# Patient Record
Sex: Female | Born: 1985 | Race: Black or African American | Hispanic: No | Marital: Single | Smoking: Never smoker
Health system: Southern US, Community
[De-identification: ages and names within clinical notes are randomized; demographics above are authoritative.]

## PROBLEM LIST (undated history)

## (undated) DIAGNOSIS — I639 Cerebral infarction, unspecified: Secondary | ICD-10-CM

## (undated) DIAGNOSIS — R569 Unspecified convulsions: Secondary | ICD-10-CM

## (undated) HISTORY — DX: Cerebral infarction, unspecified: I63.9

## (undated) HISTORY — DX: Unspecified convulsions: R56.9

---
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
## 2009-12-15 HISTORY — PX: DILATION AND CURETTAGE OF UTERUS: SHX78

## 2011-04-07 ENCOUNTER — Ambulatory Visit (INDEPENDENT_AMBULATORY_CARE_PROVIDER_SITE_OTHER): Payer: BC Managed Care – PPO | Admitting: Family Medicine

## 2011-04-07 ENCOUNTER — Encounter: Payer: Self-pay | Admitting: Family Medicine

## 2011-04-07 VITALS — BP 94/68 | HR 96 | Temp 98.0°F | Ht 59.5 in | Wt 98.2 lb

## 2011-04-07 DIAGNOSIS — Z0001 Encounter for general adult medical examination with abnormal findings: Secondary | ICD-10-CM | POA: Insufficient documentation

## 2011-04-07 DIAGNOSIS — I639 Cerebral infarction, unspecified: Secondary | ICD-10-CM | POA: Insufficient documentation

## 2011-04-07 DIAGNOSIS — Z8673 Personal history of transient ischemic attack (TIA), and cerebral infarction without residual deficits: Secondary | ICD-10-CM

## 2011-04-07 DIAGNOSIS — Z0000 Encounter for general adult medical examination without abnormal findings: Secondary | ICD-10-CM | POA: Insufficient documentation

## 2011-04-07 DIAGNOSIS — G40209 Localization-related (focal) (partial) symptomatic epilepsy and epileptic syndromes with complex partial seizures, not intractable, without status epilepticus: Secondary | ICD-10-CM | POA: Insufficient documentation

## 2011-04-07 DIAGNOSIS — G40909 Epilepsy, unspecified, not intractable, without status epilepticus: Secondary | ICD-10-CM | POA: Insufficient documentation

## 2011-04-07 LAB — COMPREHENSIVE METABOLIC PANEL
ALT: 9 U/L (ref 0–35)
Albumin: 4.4 g/dL (ref 3.5–5.2)
CO2: 26 mEq/L (ref 19–32)
Chloride: 99 mEq/L (ref 96–112)
GFR: 197.84 mL/min (ref >60.00)
Potassium: 4 mEq/L (ref 3.5–5.1)
Sodium: 138 mEq/L (ref 135–145)
Total Bilirubin: 0.6 mg/dL (ref 0.3–1.2)
Total Protein: 7.5 g/dL (ref 6.0–8.3)

## 2011-04-07 LAB — CBC WITH DIFFERENTIAL/PLATELET
Basophils Relative: 0.4 % (ref 0.0–3.0)
Eosinophils Relative: 0 % (ref 0.0–5.0)
Hemoglobin: 13.8 g/dL (ref 12.0–15.0)
Lymphocytes Relative: 23.2 % (ref 12.0–46.0)
MCV: 90.6 fl (ref 78.0–100.0)
Monocytes Absolute: 0.4 10*3/uL (ref 0.1–1.0)
Neutrophils Relative %: 70.1 % (ref 43.0–77.0)
RBC: 4.42 Mil/uL (ref 3.87–5.11)
WBC: 6.7 10*3/uL (ref 4.5–10.5)

## 2011-04-07 LAB — TSH: TSH: 0.76 u[IU]/mL (ref 0.35–5.50)

## 2011-04-07 LAB — LIPID PANEL: VLDL: 8.4 mg/dL (ref 0.0–40.0)

## 2011-04-07 MED ORDER — OXCARBAZEPINE 600 MG PO TABS: 900.0000 mg | ORAL_TABLET | Freq: Two times a day (BID) | ORAL | Status: DC

## 2011-04-07 NOTE — Progress Notes (Addendum)
Subjective:    Patient ID: Lori Ballard, female    DOB: 1986/07/08, 25 y.o.   MRN: [id number]  HPI CC: new patient, establish, CPE  No questions or concerns today.  Recently moved back into parent's house, wanted to start anew.    H/o seizures, unsure cause, started on oxcarbazepine since 2003, last seizure was years ago.  Neurology - last saw [facility], while in high school.  Prescribed trileptal in [place].  Will see who MD was there and request records.   Weight fluctuates - 89lbs to 98lbs.  Always has been small.  Eats well, 3 square meals a day.  Parents tall, weight average.  Denies eating issues, intolerances to foods.  Regular periods, LMP 2 wks ago.  Currently sexually active.  Protection 100% time.  Requests referral to GYN to get established.  Preventative: Unsure tetanus, flu Last blood work 1-2 years ago, told everything fine.  No CPE.  Medications and allergies reviewed and updated as above. PMHx, SurgHx, SHx and FMHx reviewed for relevance and updated in chart. Review of Systems  Constitutional: Positive for unexpected weight change (weight fluctuates (5-10lbs)). Negative for fever, chills, activity change, appetite change and fatigue.  HENT: Negative for hearing loss and neck pain.   Eyes: Negative for visual disturbance.  Respiratory: Negative for choking, chest tightness, shortness of breath, wheezing and stridor.   Cardiovascular: Negative for chest pain, palpitations and leg swelling.  Gastrointestinal: Negative for nausea, vomiting, abdominal pain, diarrhea, constipation, blood in stool and abdominal distention.  Genitourinary: Negative for hematuria and difficulty urinating.  Musculoskeletal: Negative for myalgias and arthralgias.  Skin: Negative for rash.  Neurological: Positive for seizures. Negative for dizziness, syncope and headaches.  Hematological: Does not bruise/bleed easily.  Psychiatric/Behavioral: Negative for dysphoric mood. The patient is not  nervous/anxious.        Objective:   Physical Exam  Nursing note and vitals reviewed. Constitutional: She is oriented to person, place, and time. She appears well-developed and well-nourished. No distress.       Thin, small frame  HENT:  Head: Normocephalic and atraumatic.  Right Ear: External ear normal.  Left Ear: External ear normal.  Nose: Nose normal.  Mouth/Throat: Oropharynx is clear and moist. No oropharyngeal exudate.  Eyes: Conjunctivae and EOM are normal. Pupils are equal, round, and reactive to light. No scleral icterus.  Neck: Normal range of motion. Neck supple. No thyromegaly present.  Cardiovascular: Normal rate, regular rhythm, normal heart sounds and intact distal pulses.   No murmur heard. Pulses:      Radial pulses are 2+ on the right side, and 2+ on the left side.  Pulmonary/Chest: Effort normal and breath sounds normal. No respiratory distress. She has no wheezes. She has no rales.  Abdominal: Soft. Bowel sounds are normal. She exhibits no distension and no mass. There is no tenderness. There is no rebound and no guarding.  Musculoskeletal: Normal range of motion. She exhibits no edema and no tenderness.       R UE baseline in flexion, hand in flexion  Lymphadenopathy:    She has no cervical adenopathy.  Neurological: She is alert and oriented to person, place, and time.       CN grossly intact, station intact.  Gait R leg walking on tip toes  Skin: Skin is warm and dry. No rash noted. No erythema.  Psychiatric: She has a normal mood and affect. Her behavior is normal. Judgment and thought content normal.  Assessment & Plan:

## 2011-04-07 NOTE — Assessment & Plan Note (Signed)
States controlled on trileptal.  Will request records from prior MD and review.  Check Na today.

## 2011-04-07 NOTE — Patient Instructions (Addendum)
Fill out release of information for [place] doctor and practice and mail to them or bring back so we can fax request. 3 meals a day, ensure getting enough calcium/vit D (goal is 1200mg  calcium, 800IU vitamin D daily). Consider starting multivitamin. Consider getting set up with GYN. Blood work today. Good to meet you today, call us with questions.

## 2011-04-07 NOTE — Assessment & Plan Note (Signed)
Discussed option of returning to PT/OT.  Pt declines currently, states does stretching exercises at home.

## 2011-04-07 NOTE — Assessment & Plan Note (Signed)
Discussed health maintenance.  Given h/o seizures, held off on immunizations today.  Will await to review records.  Provided pt with ROI to either send in herself or bring back filled out for [place] to fax. Baseline blood work today.  Pt would like well woman through GYN, will call [facility] to set that up.

## 2011-06-19 ENCOUNTER — Other Ambulatory Visit: Payer: Self-pay | Admitting: *Deleted

## 2011-06-19 MED ORDER — OXCARBAZEPINE 600 MG PO TABS: 900.0000 mg | ORAL_TABLET | Freq: Two times a day (BID) | ORAL | Status: DC

## 2011-06-19 NOTE — Telephone Encounter (Signed)
Printed and routed to Kim. 

## 2011-06-19 NOTE — Telephone Encounter (Signed)
Mom is asking for a written script to pick up so that she can mail in to [facility].

## 2011-06-19 NOTE — Telephone Encounter (Signed)
Notified that Rx ready and placed up front for pick up.

## 2011-06-30 ENCOUNTER — Other Ambulatory Visit: Payer: Self-pay | Admitting: *Deleted

## 2011-06-30 MED ORDER — OXCARBAZEPINE 600 MG PO TABS: 900.0000 mg | ORAL_TABLET | Freq: Two times a day (BID) | ORAL | Status: DC

## 2011-06-30 NOTE — Telephone Encounter (Signed)
Printed

## 2011-06-30 NOTE — Telephone Encounter (Signed)
Patient notified and Rx placed up front for pick up. 

## 2011-06-30 NOTE — Telephone Encounter (Signed)
Patient's mother says that they picked the rx up last week and mailed it to [facility] last week, but she was notified by the pos office that the rx had been lost. She is asking if they can pick up anther rx to mail in.

## 2011-08-07 ENCOUNTER — Encounter: Payer: Self-pay | Admitting: Family Medicine

## 2011-08-07 ENCOUNTER — Ambulatory Visit (INDEPENDENT_AMBULATORY_CARE_PROVIDER_SITE_OTHER): Payer: BC Managed Care – PPO | Admitting: Family Medicine

## 2011-08-07 VITALS — BP 106/70 | HR 100 | Temp 99.0°F | Wt 88.2 lb

## 2011-08-07 DIAGNOSIS — H60399 Other infective otitis externa, unspecified ear: Secondary | ICD-10-CM

## 2011-08-07 DIAGNOSIS — H6090 Unspecified otitis externa, unspecified ear: Secondary | ICD-10-CM

## 2011-08-07 MED ORDER — HYDROCORTISONE-ACETIC ACID 1-2 % OT SOLN: 4.0000 [drp] | Freq: Three times a day (TID) | OTIC | Status: AC

## 2011-08-07 NOTE — Assessment & Plan Note (Signed)
Mild. Treat with vosol hc x 3-4 days. Update if not improving or any worsening, would likely add abx drops

## 2011-08-07 NOTE — Progress Notes (Signed)
  Subjective:    Patient ID: Lori Ballard, female    DOB: [birth year], 25 y.o.   MRN: [id number]  HPI CC: L ear pain  Woke up yesterday morning with bad ear ache, feels every time swallows.  achey pain.  Intermittent, fine now.  Also hurt in front of ear.  No tooth pain, sinus pressure or congestion, hearing changes, draining, tinnitus, fevers/chills, cough.  Did return from cruise recently, but no swimming.  Review of Systems Per HPI    Objective:   Physical Exam  Nursing note and vitals reviewed. Constitutional: She appears well-developed and well-nourished. No distress.  HENT:  Head: Normocephalic and atraumatic.  Right Ear: Hearing, tympanic membrane, external ear and ear canal normal.  Left Ear: Hearing, tympanic membrane and external ear normal. No mastoid tenderness. Tympanic membrane is not perforated.  Ears:  Nose: Nose normal.  Mouth/Throat: Uvula is midline, oropharynx is clear and moist and mucous membranes are normal. No oropharyngeal exudate, posterior oropharyngeal edema, posterior oropharyngeal erythema or tonsillar abscesses.       Left posterior outer canal with slight irritation/maceration, no erythema. No external pain at pinna, mastoid, tragus.  Eyes: Conjunctivae and EOM are normal. Pupils are equal, round, and reactive to light. No scleral icterus.  Neck: Normal range of motion. Neck supple.  Lymphadenopathy:    She has no cervical adenopathy.  Skin: Skin is warm and dry. No rash noted.          Assessment & Plan:

## 2011-08-07 NOTE — Patient Instructions (Signed)
External canal looks a bit irritated - treat with ear drops for next 3-4 days.  If worsening, fever >101, or not better after this, let me know. May also try tylenol or ibuprofen for discomfort.

## 2011-10-14 ENCOUNTER — Telehealth: Payer: Self-pay | Admitting: *Deleted

## 2011-10-14 NOTE — Telephone Encounter (Signed)
Filled out and placed in Kim's box. 

## 2011-10-14 NOTE — Telephone Encounter (Signed)
Patient notified and paperwork place up front for pick up.

## 2011-10-14 NOTE — Telephone Encounter (Signed)
Pt has brought in a form for a handicapped placard, this is in your in box.

## 2012-01-27 ENCOUNTER — Ambulatory Visit: Payer: BC Managed Care – PPO | Admitting: Family Medicine

## 2012-02-03 ENCOUNTER — Ambulatory Visit (INDEPENDENT_AMBULATORY_CARE_PROVIDER_SITE_OTHER): Payer: BC Managed Care – PPO | Admitting: *Deleted

## 2012-02-03 DIAGNOSIS — Z111 Encounter for screening for respiratory tuberculosis: Secondary | ICD-10-CM

## 2012-04-05 ENCOUNTER — Other Ambulatory Visit: Payer: Self-pay | Admitting: Family Medicine

## 2012-04-05 ENCOUNTER — Telehealth: Payer: Self-pay | Admitting: Family Medicine

## 2012-04-05 NOTE — Telephone Encounter (Signed)
Pt is out of refills for Oxcarbazepine. She is wanting to know if she can get more refills. I told her she will need to make an appointment for a follow up to get the refills but she wanted to know if I would ask to make sure if she has to come in or not ??? She uses [facility] and is completely out she was wondering if she could get a 30 day supply to a drug store to get her by until her RX comes in from [facility].

## 2012-04-05 NOTE — Telephone Encounter (Signed)
Patient notified. She just wanted to make sure it was sent in. She said she should have enough to last until she receives it, but if not she will call back for a local refill.

## 2012-04-05 NOTE — Telephone Encounter (Signed)
plz notify this was sent in already.

## 2012-09-11 ENCOUNTER — Other Ambulatory Visit: Payer: Self-pay | Admitting: Family Medicine

## 2012-11-10 ENCOUNTER — Other Ambulatory Visit: Payer: Self-pay | Admitting: Family Medicine

## 2012-11-10 NOTE — Telephone Encounter (Signed)
OK to refill

## 2012-11-10 NOTE — Telephone Encounter (Signed)
Sent in.  Pt needs OV.

## 2012-12-27 ENCOUNTER — Other Ambulatory Visit: Payer: Self-pay | Admitting: Family Medicine

## 2012-12-27 NOTE — Telephone Encounter (Signed)
Pt needs office visit for med refill or physical.  plz schedule this.  Sent in 30d supply.

## 2012-12-29 NOTE — Telephone Encounter (Signed)
Patient notified and appt scheduled.

## 2012-12-31 ENCOUNTER — Ambulatory Visit (INDEPENDENT_AMBULATORY_CARE_PROVIDER_SITE_OTHER): Payer: BC Managed Care – PPO | Admitting: Family Medicine

## 2012-12-31 ENCOUNTER — Encounter: Payer: Self-pay | Admitting: Family Medicine

## 2012-12-31 VITALS — BP 100/70 | HR 84 | Temp 98.4°F | Ht 59.0 in | Wt 93.8 lb

## 2012-12-31 DIAGNOSIS — G40909 Epilepsy, unspecified, not intractable, without status epilepticus: Secondary | ICD-10-CM

## 2012-12-31 LAB — CBC WITH DIFFERENTIAL/PLATELET
Basophils Absolute: 0 10*3/uL (ref 0.0–0.1)
Eosinophils Absolute: 0 10*3/uL (ref 0.0–0.7)
Hemoglobin: 13 g/dL (ref 12.0–15.0)
Lymphocytes Relative: 32.5 % (ref 12.0–46.0)
MCHC: 33.8 g/dL (ref 30.0–36.0)
Monocytes Relative: 6.6 % (ref 3.0–12.0)
Neutro Abs: 3.2 10*3/uL (ref 1.4–7.7)
Neutrophils Relative %: 59.8 % (ref 43.0–77.0)
RBC: 4.19 Mil/uL (ref 3.87–5.11)
RDW: 13.3 % (ref 11.5–14.6)

## 2012-12-31 LAB — BASIC METABOLIC PANEL
CO2: 28 mEq/L (ref 19–32)
Chloride: 107 mEq/L (ref 96–112)
Creatinine, Ser: 0.6 mg/dL (ref 0.4–1.2)
Potassium: 3.5 mEq/L (ref 3.5–5.1)

## 2012-12-31 MED ORDER — WOMENS MULTIVITAMIN PLUS PO TABS: 1.0000 | ORAL_TABLET | Freq: Every day | ORAL | Status: DC

## 2012-12-31 MED ORDER — OXCARBAZEPINE 600 MG PO TABS: 900.0000 mg | ORAL_TABLET | Freq: Two times a day (BID) | ORAL | Status: DC

## 2012-12-31 NOTE — Assessment & Plan Note (Signed)
Recent episode does not sound consistent with seizure - advised her to monitor and if rpt episodes like this to let me know - would suggest neurology referral. Check sodium today. Refilled trileptal today. Overall has been very stable on this med.

## 2012-12-31 NOTE — Patient Instructions (Signed)
I've refilled meds. Pas by lab today for blood work. Good to see you, return as needed or in 1 year for recheck.

## 2012-12-31 NOTE — Progress Notes (Addendum)
Subjective:    Patient ID: Lori Ballard, female    DOB: [birth year], 27 y.o.   MRN: [id number]  HPI CC: med refill  [name] presents today for med refill visit.  H/o seizures, unsure cause, started on oxcarbazepine since 2003, last seizure was years ago. Neurology - last saw [facility], while in high school. Prescribed trileptal in [place].   Had seizure "scare" a few days ago - states felt seizure coming - felt R arm start to shake.  Stopped with taking deep breaths.  Was on trileptal when this happened.  Tolerating trileptal well.  Ran out for a few days but back on it now.  Preventative: Last CPE 2012 Declines flu shot. tetanus shot - declines Well woman - would like to establish with OBGYN.  Seat belt use discussed.  Caffeine: none Lives with parents, only child, 1 dog. Occupation: works at [employer] about going back to school, [employer]. Activity: no regular exercise - stays active at work Diet: good water, fruits/vegetables dialy  Wt Readings from Last 3 Encounters:  12/31/12 93 lb 12 oz (42.525 kg)  08/07/11 88 lb 4 oz (40.03 kg)  04/07/11 98 lb 4 oz (44.566 kg)  Body mass index is 18.94 kg/(m^2). 3 square meals a day  Medications and allergies reviewed and updated in chart.  Past histories reviewed and updated if relevant as below. Patient Active Problem List  Diagnosis  . Seizure disorder  . Health maintenance examination  . History of stroke  . External otitis   Past Medical History  Diagnosis Date  . Stroke     as fetus, residual LUE stiff/weak, mild LLE weak  . Seizures 2003    unsure etiology, controlled on trileptal   No past surgical history on file. History  Substance Use Topics  . Smoking status: Never Smoker   . Smokeless tobacco: Never Used  . Alcohol Use: No   Family History  Problem Relation Age of Onset  . Cancer Maternal Grandfather     lung  . Heart disease Paternal Grandfather   . Diabetes Neg Hx   .  Stroke Neg Hx   . Hyperlipidemia Neg Hx   . Hypertension Neg Hx    No Known Allergies Current Outpatient Prescriptions on File Prior to Visit  Medication Sig Dispense Refill  . oxcarbazepine (TRILEPTAL) 600 MG tablet TAKE ONE AND ONE-HALF TABLETS TWICE A DAY FOR SEIZURES (NEED APPOINTMENT)  90 tablet  0  . Multiple Vitamins-Minerals (WOMENS MULTIVITAMIN PLUS PO) Take 1 tablet by mouth daily.           Review of Systems  Constitutional: Negative for fever, chills, activity change, appetite change, fatigue and unexpected weight change.  HENT: Negative for hearing loss and neck pain.   Eyes: Negative for visual disturbance.  Respiratory: Negative for cough, chest tightness, shortness of breath and wheezing.   Cardiovascular: Negative for chest pain, palpitations and leg swelling.  Gastrointestinal: Negative for nausea, vomiting, abdominal pain, diarrhea, constipation, blood in stool and abdominal distention.  Genitourinary: Negative for hematuria and difficulty urinating.  Musculoskeletal: Negative for myalgias and arthralgias.  Skin: Negative for rash.  Neurological: Negative for dizziness, seizures, syncope and headaches.  Hematological: Does not bruise/bleed easily.  Psychiatric/Behavioral: Negative for dysphoric mood. The patient is not nervous/anxious.        Objective:   Physical Exam  Nursing note and vitals reviewed. Constitutional: She is oriented to person, place, and time. She appears well-developed and well-nourished. No distress.  HENT:  Head: Normocephalic and atraumatic.  Right Ear: Hearing, tympanic membrane, external ear and ear canal normal.  Left Ear: Hearing, tympanic membrane, external ear and ear canal normal.  Nose: Nose normal.  Mouth/Throat: Oropharynx is clear and moist. No oropharyngeal exudate.  Eyes: Conjunctivae normal and EOM are normal. Pupils are equal, round, and reactive to light. No scleral icterus.  Neck: Normal range of motion. Neck supple.    Cardiovascular: Normal rate, regular rhythm, normal heart sounds and intact distal pulses.   No murmur heard. Pulses:      Radial pulses are 2+ on the right side, and 2+ on the left side.  Pulmonary/Chest: Effort normal and breath sounds normal. No respiratory distress. She has no wheezes. She has no rales.  Musculoskeletal: Normal range of motion.  Lymphadenopathy:    She has no cervical adenopathy.  Neurological: She is alert and oriented to person, place, and time.       CN grossly intact, station and gait intact R UE baseline in flexion, hand in flexion   Skin: Skin is warm and dry. No rash noted.  Psychiatric: She has a normal mood and affect. Her behavior is normal. Judgment and thought content normal.       Assessment & Plan:  Advised to schedule appt with [facility] for well woman exam.  Pt state she will call for appt.

## 2013-01-03 ENCOUNTER — Encounter: Payer: Self-pay | Admitting: *Deleted

## 2013-01-18 ENCOUNTER — Encounter: Payer: BC Managed Care – PPO | Admitting: Obstetrics & Gynecology

## 2013-01-19 ENCOUNTER — Encounter: Payer: BC Managed Care – PPO | Admitting: Obstetrics & Gynecology

## 2013-02-01 ENCOUNTER — Other Ambulatory Visit: Payer: Self-pay | Admitting: Family Medicine

## 2013-02-02 ENCOUNTER — Encounter: Payer: Self-pay | Admitting: Obstetrics & Gynecology

## 2013-02-02 ENCOUNTER — Ambulatory Visit (INDEPENDENT_AMBULATORY_CARE_PROVIDER_SITE_OTHER): Payer: BC Managed Care – PPO | Admitting: Obstetrics & Gynecology

## 2013-02-02 VITALS — BP 110/75 | HR 104 | Ht 59.0 in | Wt 96.0 lb

## 2013-02-02 DIAGNOSIS — Z124 Encounter for screening for malignant neoplasm of cervix: Secondary | ICD-10-CM

## 2013-02-02 DIAGNOSIS — Z113 Encounter for screening for infections with a predominantly sexual mode of transmission: Secondary | ICD-10-CM

## 2013-02-02 DIAGNOSIS — Z01419 Encounter for gynecological examination (general) (routine) without abnormal findings: Secondary | ICD-10-CM

## 2013-02-02 DIAGNOSIS — Z3009 Encounter for other general counseling and advice on contraception: Secondary | ICD-10-CM

## 2013-02-02 NOTE — Progress Notes (Signed)
Patient ID: Lori Ballard, female   DOB: Mar 11, 1986, 27 y.o.   MRN: [id number] Subjective:     Lori Ballard is a 27 y.o. female here for a routine exam.  Current complaints: none.  Pt is sexually active.  On no contraception at present.  Desires contraception counseling.     Gynecologic History Patient's last menstrual period was 01/23/2013. Contraception: none Last Pap: never had. Results were: n/a Last mammogram: n/a. Results were: n/a  Obstetric History OB History   Grav Para Term Preterm Abortions TAB SAB Ect Mult Living   1    1 1          # Outc Date GA Lbr Len/2nd Wgt Sex Del Anes PTL Lv   1 TAB 2011        No       The following portions of the patient's history were reviewed and updated as appropriate: allergies, current medications, past family history, past medical history, past social history, past surgical history and problem list.  Review of Systems Pertinent items are noted in HPI.    Objective:    BP 110/75  Pulse 104  Ht 4\' 11"  (1.499 m)  Wt 96 lb (43.545 kg)  BMI 19.38 kg/m2  LMP 01/23/2013  General Appearance:    Alert, cooperative, no distress, appears stated age                 Neck:   Supple, symmetrical, trachea midline, no adenopathy;    thyroid:  no enlargement/tenderness/nodules; no carotid   bruit or JVD  Back:     Symmetric, no curvature, ROM normal, no CVA tenderness  Lungs:     Clear to auscultation bilaterally, respirations unlabored  Chest Wall:    No tenderness or deformity   Heart:    Regular rate and rhythm, S1 and S2 normal, no murmur, rub   or gallop  Breast Exam:    No tenderness, masses, or nipple abnormality  Abdomen:     Soft, non-tender, bowel sounds active all four quadrants,    no masses, no organomegaly  Genitalia:    Normal female without lesion, discharge or tenderness  GU: EGBUS: no lesions Vagina: no blood in vault Cervix: no lesion; no mucopurulent d/c Uterus: small, mobile Adnexa:nomasses;nontender             Skin:   Skin color, texture, turgor normal, no rashes or lesions  Lymph nodes:   Cervical, supraclavicular, and axillary nodes normal         Assessment:    Healthy female exam.  Contraception counseling    Plan:  Pt will read on the info for Sylar/mirena and call if she decides to get it. I explained to the pt the importance of being on contraception given her medical condition and her meds.   Follow up in: 1 year.

## 2013-02-02 NOTE — Patient Instructions (Signed)
Levonorgestrel intrauterine device (IUD) What is this medicine? LEVONORGESTREL IUD (LEE voe nor jes trel) is a contraceptive (birth control) device. It is used to prevent pregnancy and to treat heavy bleeding that occurs during your period. It can be used for up to 5 years. This medicine may be used for other purposes; ask your health care provider or pharmacist if you have questions. What should I tell my health care provider before I take this medicine? They need to know if you have any of these conditions: -abnormal Pap smear -cancer of the breast, uterus, or cervix -diabetes -endometritis -genital or pelvic infection now or in the past -have more than one sexual partner or your partner has more than one partner -heart disease -history of an ectopic or tubal pregnancy -immune system problems -IUD in place -liver disease or tumor -problems with blood clots or take blood-thinners -use intravenous drugs -uterus of unusual shape -vaginal bleeding that has not been explained -an unusual or allergic reaction to levonorgestrel, other hormones, silicone, or polyethylene, medicines, foods, dyes, or preservatives -pregnant or trying to get pregnant -breast-feeding How should I use this medicine? This device is placed inside the uterus by a health care professional. Talk to your pediatrician regarding the use of this medicine in children. Special care may be needed. Overdosage: If you think you have taken too much of this medicine contact a poison control center or emergency room at once. NOTE: This medicine is only for you. Do not share this medicine with others. What if I miss a dose? This does not apply. What may interact with this medicine? Do not take this medicine with any of the following medications: -amprenavir -bosentan -fosamprenavir This medicine may also interact with the following medications: -aprepitant -barbiturate medicines for inducing sleep or treating  seizures -bexarotene -griseofulvin -medicines to treat seizures like carbamazepine, ethotoin, felbamate, oxcarbazepine, phenytoin, topiramate -modafinil -pioglitazone -rifabutin -rifampin -rifapentine -some medicines to treat HIV infection like atazanavir, indinavir, lopinavir, nelfinavir, tipranavir, ritonavir -St. John's wort -warfarin This list may not describe all possible interactions. Give your health care provider a list of all the medicines, herbs, non-prescription drugs, or dietary supplements you use. Also tell them if you smoke, drink alcohol, or use illegal drugs. Some items may interact with your medicine. What should I watch for while using this medicine? Visit your doctor or health care professional for regular check ups. See your doctor if you or your partner has sexual contact with others, becomes HIV positive, or gets a sexual transmitted disease. This product does not protect you against HIV infection (AIDS) or other sexually transmitted diseases. You can check the placement of the IUD yourself by reaching up to the top of your vagina with clean fingers to feel the threads. Do not pull on the threads. It is a good habit to check placement after each menstrual period. Call your doctor right away if you feel more of the IUD than just the threads or if you cannot feel the threads at all. The IUD may come out by itself. You may become pregnant if the device comes out. If you notice that the IUD has come out use a backup birth control method like condoms and call your health care provider. Using tampons will not change the position of the IUD and are okay to use during your period. What side effects may I notice from receiving this medicine? Side effects that you should report to your doctor or health care professional as soon as possible: -allergic reactions   like skin rash, itching or hives, swelling of the face, lips, or tongue -fever, flu-like symptoms -genital sores -high  blood pressure -no menstrual period for 6 weeks during use -pain, swelling, warmth in the leg -pelvic pain or tenderness -severe or sudden headache -signs of pregnancy -stomach cramping -sudden shortness of breath -trouble with balance, talking, or walking -unusual vaginal bleeding, discharge -yellowing of the eyes or skin Side effects that usually do not require medical attention (report to your doctor or health care professional if they continue or are bothersome): -acne -breast pain -change in sex drive or performance -changes in weight -cramping, dizziness, or faintness while the device is being inserted -headache -irregular menstrual bleeding within first 3 to 6 months of use -nausea This list may not describe all possible side effects. Call your doctor for medical advice about side effects. You may report side effects to FDA at 1-800-FDA-1088. Where should I keep my medicine? This does not apply. NOTE: This sheet is a summary. It may not cover all possible information. If you have questions about this medicine, talk to your doctor, pharmacist, or health care provider.  2012, Elsevier/Gold Standard. (12/22/2008 6:39:08 PM)Contraception Choices Contraception (birth control) is the use of any methods or devices to prevent pregnancy. Below are some methods to help avoid pregnancy. HORMONAL METHODS   Contraceptive implant. This is a thin, plastic tube containing progesterone hormone. It does not contain estrogen hormone. Your caregiver inserts the tube in the inner part of the upper arm. The tube can remain in place for up to 3 years. After 3 years, the implant must be removed. The implant prevents the ovaries from releasing an egg (ovulation), thickens the cervical mucus which prevents sperm from entering the uterus, and thins the lining of the inside of the uterus.  Progesterone-only injections. These injections are given every 3 months by your caregiver to prevent pregnancy. This  synthetic progesterone hormone stops the ovaries from releasing eggs. It also thickens cervical mucus and changes the uterine lining. This makes it harder for sperm to survive in the uterus.  Birth control pills. These pills contain estrogen and progesterone hormone. They work by stopping the egg from forming in the ovary (ovulation). Birth control pills are prescribed by a caregiver.Birth control pills can also be used to treat heavy periods.  Minipill. This type of birth control pill contains only the progesterone hormone. They are taken every day of each month and must be prescribed by your caregiver.  Birth control patch. The patch contains hormones similar to those in birth control pills. It must be changed once a week and is prescribed by a caregiver.  Vaginal ring. The ring contains hormones similar to those in birth control pills. It is left in the vagina for 3 weeks, removed for 1 week, and then a new one is put back in place. The patient must be comfortable inserting and removing the ring from the vagina.A caregiver's prescription is necessary.  Emergency contraception. Emergency contraceptives prevent pregnancy after unprotected sexual intercourse. This pill can be taken right after sex or up to 5 days after unprotected sex. It is most effective the sooner you take the pills after having sexual intercourse. Emergency contraceptive pills are available without a prescription. Check with your pharmacist. Do not use emergency contraception as your only form of birth control. BARRIER METHODS   Female condom. This is a thin sheath (latex or rubber) that is worn over the penis during sexual intercourse. It can be used with spermicide  to increase effectiveness.  Female condom. This is a soft, loose-fitting sheath that is put into the vagina before sexual intercourse.  Diaphragm. This is a soft, latex, dome-shaped barrier that must be fitted by a caregiver. It is inserted into the vagina, along  with a spermicidal jelly. It is inserted before intercourse. The diaphragm should be left in the vagina for 6 to 8 hours after intercourse.  Cervical cap. This is a round, soft, latex or plastic cup that fits over the cervix and must be fitted by a caregiver. The cap can be left in place for up to 48 hours after intercourse.  Sponge. This is a soft, circular piece of polyurethane foam. The sponge has spermicide in it. It is inserted into the vagina after wetting it and before sexual intercourse.  Spermicides. These are chemicals that kill or block sperm from entering the cervix and uterus. They come in the form of creams, jellies, suppositories, foam, or tablets. They do not require a prescription. They are inserted into the vagina with an applicator before having sexual intercourse. The process must be repeated every time you have sexual intercourse. INTRAUTERINE CONTRACEPTION  Intrauterine device (IUD). This is a T-shaped device that is put in a woman's uterus during a menstrual period to prevent pregnancy. There are 2 types:  Copper IUD. This type of IUD is wrapped in copper wire and is placed inside the uterus. Copper makes the uterus and fallopian tubes produce a fluid that kills sperm. It can stay in place for 10 years.  Hormone IUD. This type of IUD contains the hormone progestin (synthetic progesterone). The hormone thickens the cervical mucus and prevents sperm from entering the uterus, and it also thins the uterine lining to prevent implantation of a fertilized egg. The hormone can weaken or kill the sperm that get into the uterus. It can stay in place for 5 years. PERMANENT METHODS OF CONTRACEPTION  Female tubal ligation. This is when the woman's fallopian tubes are surgically sealed, tied, or blocked to prevent the egg from traveling to the uterus.  Female sterilization. This is when the female has the tubes that carry sperm tied off (vasectomy).This blocks sperm from entering the vagina  during sexual intercourse. After the procedure, the man can still ejaculate fluid (semen). NATURAL PLANNING METHODS  Natural family planning. This is not having sexual intercourse or using a barrier method (condom, diaphragm, cervical cap) on days the woman could become pregnant.  Calendar method. This is keeping track of the length of each menstrual cycle and identifying when you are fertile.  Ovulation method. This is avoiding sexual intercourse during ovulation.  Symptothermal method. This is avoiding sexual intercourse during ovulation, using a thermometer and ovulation symptoms.  Post-ovulation method. This is timing sexual intercourse after you have ovulated. Regardless of which type or method of contraception you choose, it is important that you use condoms to protect against the transmission of sexually transmitted diseases (STDs). Talk with your caregiver about which form of contraception is most appropriate for you. Document Released: 12/01/2005 Document Revised: 02/23/2012 Document Reviewed: 04/09/2011 [facility] Patient Information 2013 [place], [place].

## 2013-12-27 ENCOUNTER — Other Ambulatory Visit: Payer: Self-pay | Admitting: Family Medicine

## 2013-12-27 ENCOUNTER — Other Ambulatory Visit (INDEPENDENT_AMBULATORY_CARE_PROVIDER_SITE_OTHER): Payer: BC Managed Care – PPO

## 2013-12-27 DIAGNOSIS — G40909 Epilepsy, unspecified, not intractable, without status epilepticus: Secondary | ICD-10-CM

## 2013-12-27 LAB — CBC WITH DIFFERENTIAL/PLATELET
BASOS ABS: 0 10*3/uL (ref 0.0–0.1)
BASOS PCT: 0.3 % (ref 0.0–3.0)
EOS ABS: 0.1 10*3/uL (ref 0.0–0.7)
Eosinophils Relative: 1.6 % (ref 0.0–5.0)
HCT: 39.1 % (ref 36.0–46.0)
Hemoglobin: 13.2 g/dL (ref 12.0–15.0)
Lymphocytes Relative: 31.3 % (ref 12.0–46.0)
Lymphs Abs: 1.9 10*3/uL (ref 0.7–4.0)
MCHC: 33.8 g/dL (ref 30.0–36.0)
MCV: 91.1 fl (ref 78.0–100.0)
MONOS PCT: 8.4 % (ref 3.0–12.0)
Monocytes Absolute: 0.5 10*3/uL (ref 0.1–1.0)
NEUTROS ABS: 3.6 10*3/uL (ref 1.4–7.7)
NEUTROS PCT: 58.4 % (ref 43.0–77.0)
Platelets: 316 10*3/uL (ref 150.0–400.0)
RBC: 4.29 Mil/uL (ref 3.87–5.11)
RDW: 13.1 % (ref 11.5–14.6)
WBC: 6.2 10*3/uL (ref 4.5–10.5)

## 2013-12-27 LAB — BASIC METABOLIC PANEL
BUN: 15 mg/dL (ref 6–23)
CHLORIDE: 105 meq/L (ref 96–112)
CO2: 26 meq/L (ref 19–32)
Calcium: 9.2 mg/dL (ref 8.4–10.5)
Creatinine, Ser: 0.7 mg/dL (ref 0.4–1.2)
GFR: 122.94 mL/min (ref >60.00)
Glucose, Bld: 84 mg/dL (ref 70–99)
POTASSIUM: 3.7 meq/L (ref 3.5–5.1)
Sodium: 137 mEq/L (ref 135–145)

## 2013-12-27 LAB — TSH: TSH: 1.11 u[IU]/mL (ref 0.35–5.50)

## 2013-12-29 ENCOUNTER — Ambulatory Visit (INDEPENDENT_AMBULATORY_CARE_PROVIDER_SITE_OTHER): Payer: BC Managed Care – PPO | Admitting: Family Medicine

## 2013-12-29 ENCOUNTER — Encounter: Payer: Self-pay | Admitting: Family Medicine

## 2013-12-29 VITALS — BP 116/60 | HR 92 | Temp 99.3°F | Ht 60.25 in | Wt 97.2 lb

## 2013-12-29 DIAGNOSIS — R059 Cough, unspecified: Secondary | ICD-10-CM | POA: Insufficient documentation

## 2013-12-29 DIAGNOSIS — G40909 Epilepsy, unspecified, not intractable, without status epilepticus: Secondary | ICD-10-CM

## 2013-12-29 DIAGNOSIS — Z0000 Encounter for general adult medical examination without abnormal findings: Secondary | ICD-10-CM

## 2013-12-29 DIAGNOSIS — R05 Cough: Secondary | ICD-10-CM

## 2013-12-29 MED ORDER — OXCARBAZEPINE 600 MG PO TABS: 900.0000 mg | ORAL_TABLET | Freq: Two times a day (BID) | ORAL | Status: DC

## 2013-12-29 NOTE — Patient Instructions (Signed)
Good to see you today, call us with questions. Blood work was ok today. Return as needed or in 1 year for next annual exam. I think you have a post infectious cough - should continue to improve each day. Let me know if fever >101, worsening productive cough or sudden deterioration after initial improvement - all these things would make us worried about bacterial infection. Push fluids and plenty of rest. Continue nyquil at night time.

## 2013-12-29 NOTE — Progress Notes (Signed)
Subjective:    Patient ID: Lori Ballard, female    DOB: Jan 30, 1986, 28 y.o.   MRN: [id number]  HPI CC: CPE  Lori Ballard presents today for an annual exam.   She feels like she's catching a cold.  For last 2 weeks, coughing, nasal congestion worse at night time.  No ear or tooth pain, headaches, ST, PNDrainage.  So far has tried OTC remedies like nyquil which helps.  No sick contacts at home.  No smokers at home.  No h/o asthma. Cough keeping her up at night time.  H/o seizures, unsure cause, started on oxcarbazepine since 2003, last seizure was years ago. Neurology - last saw [facility], while in high school.   Preventative:  Declines flu shot.  tetanus shot - declines  Well woman - saw Dr. Erin Fulling.  LMP 12/01/2013.  Still considering contraception options.  Currently uses condoms %100 when sexually active.  1 partner in last year.  No concerns for STDs.  Seat belt use discussed.  Caffeine: none  Lives with parents, only child, 1 dog.  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  Past histories reviewed and updated if relevant as below. Patient Active Problem List   Diagnosis Date Noted  . Seizure disorder 04/07/2011  . Health maintenance examination 04/07/2011  . History of stroke 04/07/2011   Past Medical History  Diagnosis Date  . Stroke     as fetus, residual LUE stiff/weak, mild LLE weak  . Seizures 2003    unsure etiology, controlled on trileptal   Past Surgical History  Procedure Laterality Date  . Dilation and curettage of uterus  2011    Abortion   History  Substance Use Topics  . Smoking status: Never Smoker   . Smokeless tobacco: Never Used  . Alcohol Use: No   Family History  Problem Relation Age of Onset  . Cancer Maternal Grandfather     lung    . Heart disease Paternal Grandfather   . Diabetes Neg Hx   . Stroke Neg Hx   . Hyperlipidemia Neg Hx   . Hypertension Neg Hx    No Known Allergies Current Outpatient Prescriptions on File Prior to Visit  Medication Sig Dispense Refill  . oxcarbazepine (TRILEPTAL) 600 MG tablet Take 1.5 tablets (900 mg total) by mouth 2 (two) times daily.  90 tablet  11  . Multiple Vitamins-Minerals (WOMENS MULTIVITAMIN PLUS) TABS Take 1 tablet by mouth daily.  90 tablet  3   No current facility-administered medications on file prior to visit.     Review of Systems  Constitutional: Negative for fever, chills, activity change, appetite change, fatigue and unexpected weight change.  HENT: Negative for hearing loss.   Eyes: Negative for visual disturbance.  Respiratory: Positive for cough. Negative for chest tightness, shortness of breath and wheezing.   Cardiovascular: Negative for chest pain, palpitations and leg swelling.  Gastrointestinal: Negative for nausea, vomiting, abdominal pain, diarrhea, constipation, blood in stool and abdominal distention.  Genitourinary: Negative for hematuria and difficulty urinating.  Musculoskeletal: Negative for arthralgias, myalgias and neck pain.  Skin: Negative for rash.  Neurological: Negative for dizziness, seizures, syncope and headaches.  Hematological: Negative for adenopathy. Does not bruise/bleed easily.  Psychiatric/Behavioral: Negative for dysphoric mood. The patient is not nervous/anxious.  Objective:   Physical Exam  Nursing note and vitals reviewed. Constitutional: She is oriented to person, place, and time. She appears well-developed and well-nourished. No distress.  HENT:  Head: Normocephalic and atraumatic.  Right Ear: Hearing, tympanic membrane, external ear and ear canal normal.  Left Ear: Hearing, tympanic membrane, external ear and ear canal normal.  Nose: Nose normal. No mucosal edema or rhinorrhea. Right sinus exhibits no maxillary  sinus tenderness and no frontal sinus tenderness. Left sinus exhibits no maxillary sinus tenderness and no frontal sinus tenderness.  Mouth/Throat: Uvula is midline, oropharynx is clear and moist and mucous membranes are normal. No oropharyngeal exudate, posterior oropharyngeal edema, posterior oropharyngeal erythema or tonsillar abscesses.  Eyes: Conjunctivae and EOM are normal. Pupils are equal, round, and reactive to light. No scleral icterus.  Neck: Normal range of motion. Neck supple.  Cardiovascular: Normal rate, regular rhythm, normal heart sounds and intact distal pulses.   No murmur heard. Pulses:      Radial pulses are 2+ on the right side, and 2+ on the left side.  Pulmonary/Chest: Effort normal and breath sounds normal. No respiratory distress. She has no wheezes. She has no rales.  Abdominal: Soft. Bowel sounds are normal. She exhibits no distension and no mass. There is no tenderness. There is no rebound and no guarding.  Musculoskeletal: Normal range of motion. She exhibits no edema.  Chronic R hand deformity  Lymphadenopathy:    She has no cervical adenopathy.  Neurological: She is alert and oriented to person, place, and time.  CN grossly intact, station and gait intact  Skin: Skin is warm and dry. No rash noted.  Psychiatric: She has a normal mood and affect. Her behavior is normal. Judgment and thought content normal.       Assessment & Plan:

## 2013-12-29 NOTE — Assessment & Plan Note (Signed)
Anticipate post infectious cough .  No indication of bacterial infection today - lungs clear. Supportive care as per instructions. Discussed red flags to notify us.

## 2013-12-29 NOTE — Progress Notes (Signed)
Pre-visit discussion using our clinic review tool. No additional management support is needed unless otherwise documented below in the visit note.  

## 2013-12-29 NOTE — Assessment & Plan Note (Signed)
Preventative protocols reviewed and updated unless pt declined. Discussed healthy diet and lifestyle. Has established with OBGYN for well woman exams.

## 2013-12-29 NOTE — Assessment & Plan Note (Signed)
Chronic, stable. Continue lamictal.

## 2014-10-16 ENCOUNTER — Encounter: Payer: Self-pay | Admitting: Family Medicine

## 2014-11-21 ENCOUNTER — Other Ambulatory Visit: Payer: Self-pay | Admitting: Family Medicine

## 2014-11-21 NOTE — Telephone Encounter (Signed)
Ok to refill 

## 2015-02-19 ENCOUNTER — Other Ambulatory Visit: Payer: Self-pay | Admitting: Family Medicine

## 2015-03-27 ENCOUNTER — Ambulatory Visit (INDEPENDENT_AMBULATORY_CARE_PROVIDER_SITE_OTHER): Payer: 59 | Admitting: Family Medicine

## 2015-03-27 ENCOUNTER — Encounter: Payer: Self-pay | Admitting: Family Medicine

## 2015-03-27 ENCOUNTER — Other Ambulatory Visit: Payer: Self-pay | Admitting: *Deleted

## 2015-03-27 VITALS — BP 102/60 | HR 94 | Temp 99.5°F | Resp 16 | Ht 59.0 in | Wt 93.4 lb

## 2015-03-27 DIAGNOSIS — G40909 Epilepsy, unspecified, not intractable, without status epilepticus: Secondary | ICD-10-CM

## 2015-03-27 LAB — CBC WITH DIFFERENTIAL/PLATELET
Basophils Absolute: 0 10*3/uL (ref 0.0–0.1)
Basophils Relative: 0.3 % (ref 0.0–3.0)
EOS PCT: 0.2 % (ref 0.0–5.0)
Eosinophils Absolute: 0 10*3/uL (ref 0.0–0.7)
HEMATOCRIT: 38.7 % (ref 36.0–46.0)
Hemoglobin: 13.2 g/dL (ref 12.0–15.0)
Lymphocytes Relative: 27.9 % (ref 12.0–46.0)
Lymphs Abs: 1.5 10*3/uL (ref 0.7–4.0)
MCHC: 34 g/dL (ref 30.0–36.0)
MCV: 89.6 fl (ref 78.0–100.0)
Monocytes Absolute: 0.4 10*3/uL (ref 0.1–1.0)
Monocytes Relative: 7.1 % (ref 3.0–12.0)
NEUTROS ABS: 3.4 10*3/uL (ref 1.4–7.7)
NEUTROS PCT: 64.5 % (ref 43.0–77.0)
PLATELETS: 299 10*3/uL (ref 150.0–400.0)
RBC: 4.32 Mil/uL (ref 3.87–5.11)
RDW: 12.9 % (ref 11.5–15.5)
WBC: 5.2 10*3/uL (ref 4.0–10.5)

## 2015-03-27 LAB — BASIC METABOLIC PANEL
BUN: 13 mg/dL (ref 6–23)
CO2: 26 meq/L (ref 19–32)
Calcium: 9.5 mg/dL (ref 8.4–10.5)
Chloride: 104 mEq/L (ref 96–112)
Creatinine, Ser: 0.63 mg/dL (ref 0.40–1.20)
GFR: 146.57 mL/min (ref >60.00)
GLUCOSE: 80 mg/dL (ref 70–99)
POTASSIUM: 3.8 meq/L (ref 3.5–5.1)
SODIUM: 135 meq/L (ref 135–145)

## 2015-03-27 LAB — TSH: TSH: 0.9 u[IU]/mL (ref 0.35–4.50)

## 2015-03-27 MED ORDER — OXCARBAZEPINE 600 MG PO TABS: ORAL_TABLET | ORAL | Status: DC

## 2015-03-27 NOTE — Progress Notes (Signed)
Pre visit review using our clinic review tool, if applicable. No additional management support is needed unless otherwise documented below in the visit note. 

## 2015-03-27 NOTE — Patient Instructions (Addendum)
labwork today. Will refill trileptal. Return as needed or in 1 year for physical. Consider tetanus shot (Tdap with pertussis).

## 2015-03-27 NOTE — Progress Notes (Signed)
BP 102/60 mmHg  Pulse 94  Temp(Src) 99.5 F (37.5 C) (Oral)  Resp 16  Ht  (1.499 m)  Wt 93 lb 6.4 oz (42.366 kg)  BMI 18.85 kg/m2  SpO2 96%  LMP 02/19/2015   CC: med refill visit  Subjective:    Patient ID: Lori Ballard, female    DOB: 1986-01-19, 29 y.o.   MRN: [id number]  HPI: Lori Ballard is a 29 y.o. female presenting on 03/27/2015 for Medication Refill   Last seen 12/2013.   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 Also takes MVI daily. No seizure for years. Drives. Does all ADLs she needs to do.   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  LMP 02/19/2015. Regular cycles.  Caffeine: none Lives with parents, only child, 1 dog (shitzu) Occupation: works at [employer] about going back to school, [employer]. Activity: no regular exercise - stays active at work Diet: good water, fruits/vegetables daily   Relevant past medical, surgical, family and social history reviewed and updated as indicated. Interim medical history since our last visit reviewed. Allergies and medications reviewed and updated. Current Outpatient Prescriptions on File Prior to Visit  Medication Sig  . Multiple Vitamins-Minerals (WOMENS MULTIVITAMIN PLUS) TABS Take 1 tablet by mouth daily.  Marland Kitchen oxcarbazepine (TRILEPTAL) 600 MG tablet TAKE ONE AND ONE-HALF TABLETS TWICE A DAY   No current facility-administered medications on file prior to visit.    Review of Systems Per HPI unless specifically indicated above     Objective:    BP 102/60 mmHg  Pulse 94  Temp(Src) 99.5 F (37.5 C) (Oral)  Resp 16  Ht  (1.499 m)  Wt 93 lb 6.4 oz (42.366 kg)  BMI 18.85 kg/m2  SpO2 96%  LMP 02/19/2015  Wt Readings from Last 3 Encounters:  03/27/15 93 lb 6.4 oz (42.366 kg)  12/29/13 97 lb 4 oz (44.112 kg)  02/02/13 96 lb (43.545 kg)    Physical Exam  Constitutional: She appears well-developed and well-nourished. No distress.    HENT:  Mouth/Throat: Oropharynx is clear and moist. No oropharyngeal exudate.  Eyes: Conjunctivae and EOM are normal. Pupils are equal, round, and reactive to light. No scleral icterus.  Neck: Normal range of motion. Neck supple. No thyromegaly present.  Cardiovascular: Normal rate, regular rhythm, normal heart sounds and intact distal pulses.   No murmur heard. Pulmonary/Chest: Effort normal and breath sounds normal. No respiratory distress. She has no wheezes. She has no rales.  Abdominal: Soft. Bowel sounds are normal. She exhibits no distension and no mass. There is no tenderness. There is no rebound and no guarding.  Musculoskeletal: She exhibits no edema.  Lymphadenopathy:    She has no cervical adenopathy.  Neurological:  RUE spastic hemiparesis present  Skin: Skin is warm and dry. No rash noted.  Psychiatric: She has a normal mood and affect.  Nursing note and vitals reviewed.  Results for orders placed or performed in visit on 12/27/13  TSH  Result Value Ref Range   TSH 1.11 0.35 - 5.50 uIU/mL  CBC with Differential  Result Value Ref Range   WBC 6.2 4.5 - 10.5 K/uL   RBC 4.29 3.87 - 5.11 Mil/uL   Hemoglobin 13.2 12.0 - 15.0 g/dL   HCT 78.2 95.6 - 21.3 %   MCV 91.1 78.0 - 100.0 fl   MCHC 33.8 30.0 - 36.0 g/dL   RDW 08.6 57.8 - 46.9 %  Platelets 316.0 150.0 - 400.0 K/uL   Neutrophils Relative % 58.4 43.0 - 77.0 %   Lymphocytes Relative 31.3 12.0 - 46.0 %   Monocytes Relative 8.4 3.0 - 12.0 %   Eosinophils Relative 1.6 0.0 - 5.0 %   Basophils Relative 0.3 0.0 - 3.0 %   Neutro Abs 3.6 1.4 - 7.7 K/uL   Lymphs Abs 1.9 0.7 - 4.0 K/uL   Monocytes Absolute 0.5 0.1 - 1.0 K/uL   Eosinophils Absolute 0.1 0.0 - 0.7 K/uL   Basophils Absolute 0.0 0.0 - 0.1 K/uL  Basic metabolic panel  Result Value Ref Range   Sodium 137 135 - 145 mEq/L   Potassium 3.7 3.5 - 5.1 mEq/L   Chloride 105 96 - 112 mEq/L   CO2 26 19 - 32 mEq/L   Glucose, Bld 84 70 - 99 mg/dL   BUN 15 6 - 23 mg/dL    Creatinine, Ser 0.7 0.4 - 1.2 mg/dL   Calcium 9.2 8.4 - 40.910.5 mg/dL   GFR 811.91122.94 >47.82>60.00 mL/min      Assessment & Plan:   Problem List Items Addressed This Visit    Seizure disorder - Primary    Chronic, stable. Continue trileptal as controlling sxs well. Check labs today. Pt without concerns today. RTC 1 yr CPE.      Relevant Orders   TSH   CBC with Differential/Platelet   Basic metabolic panel       Follow up plan: Return in about 1 year (around 03/26/2016), or as needed, for annual exam, prior fasting for blood work.

## 2015-03-27 NOTE — Assessment & Plan Note (Signed)
Chronic, stable. Continue trileptal as controlling sxs well. Check labs today. Pt without concerns today. RTC 1 yr CPE.

## 2015-03-28 ENCOUNTER — Encounter: Payer: Self-pay | Admitting: *Deleted

## 2015-11-26 ENCOUNTER — Ambulatory Visit (INDEPENDENT_AMBULATORY_CARE_PROVIDER_SITE_OTHER): Payer: 59 | Admitting: Family Medicine

## 2015-11-26 ENCOUNTER — Encounter: Payer: Self-pay | Admitting: Family Medicine

## 2015-11-26 VITALS — BP 102/66 | HR 130 | Temp 99.0°F | Wt 92.0 lb

## 2015-11-26 DIAGNOSIS — G40909 Epilepsy, unspecified, not intractable, without status epilepticus: Secondary | ICD-10-CM

## 2015-11-26 DIAGNOSIS — R000 Tachycardia, unspecified: Secondary | ICD-10-CM

## 2015-11-26 MED ORDER — OXCARBAZEPINE 600 MG PO TABS: ORAL_TABLET | ORAL | Status: DC

## 2015-11-26 NOTE — Patient Instructions (Addendum)
No charge today. Keep an eye on pulse - count how many times your heart best in 30 seconds then multiply by 2. That is your heart beat (per minute). Get a few readings over next 2 weeks when you're at home resting - and call me with #s.  Return for physical after January 15th.  Push fluids to stay well hydrated.

## 2015-11-26 NOTE — Progress Notes (Signed)
Pre visit review using our clinic review tool, if applicable. No additional management support is needed unless otherwise documented below in the visit note. 

## 2015-11-26 NOTE — Progress Notes (Signed)
   BP 102/66 mmHg  Pulse 130  Temp(Src) 99 F (37.2 C) (Oral)  Wt 92 lb (41.731 kg)  SpO2 98%  LMP 10/29/2015   CC: med refill viist  Subjective:    Patient ID: Lori Ballard, female    DOB: 29-Jul-1986, 29 y.o.   MRN: [id number]  HPI: Lori Ballard is a 29 y.o. female presenting on 11/26/2015 for Medication Refill and Flu Vaccine   Last seen 03/2015. Came in today because she thought she was due for med refill visit, but actually has meds through April.  Tachycardia noted today. Denies palpitations, dyspnea, chest pain, arrhythmia. Pulse runs elevated.  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 Also takes MVI daily. No seizure for years. Drives. Does all ADLs she needs to do.    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   Relevant past medical, surgical, family and social history reviewed and updated as indicated. Interim medical history since our last visit reviewed. Allergies and medications reviewed and updated. Current Outpatient Prescriptions on File Prior to Visit  Medication Sig  . Multiple Vitamins-Minerals (WOMENS MULTIVITAMIN PLUS) TABS Take 1 tablet by mouth daily.   No current facility-administered medications on file prior to visit.    Review of Systems Per HPI unless specifically indicated in ROS section     Objective:    BP 102/66 mmHg  Pulse 130  Temp(Src) 99 F (37.2 C) (Oral)  Wt 92 lb (41.731 kg)  SpO2 98%  LMP 10/29/2015  Wt Readings from Last 3 Encounters:  11/26/15 92 lb (41.731 kg)  03/27/15 93 lb 6.4 oz (42.366 kg)  12/29/13 97 lb 4 oz (44.112 kg)    Physical Exam  Constitutional: She appears well-developed and well-nourished. No distress.  Cardiovascular: Regular rhythm, normal heart sounds and intact distal pulses.  Tachycardia present.   No murmur heard. Pulmonary/Chest: Effort normal and breath sounds normal. No respiratory distress. She has no wheezes. She has no rales.   Psychiatric: She has a normal mood and affect.  Nursing note and vitals reviewed.      Assessment & Plan:  No charge visit today as she was not due for med refill. Will monitor tachycardia and recheck at CPE in January. Problem List Items Addressed This Visit    Tachycardia - Primary    Of unclear etiology today. Denies possible dehydration. Few caffeinated beverages daily.  Recommended she monitor HR at home - taught how to check - and update me if persistently >100. Sounds regular today.  Trileptal has tachycardia listed as possible side effect, but doubt related to chronic med use.      Seizure disorder (HCC)       Follow up plan: Return as needed, for annual exam, prior fasting for blood work.

## 2015-11-26 NOTE — Assessment & Plan Note (Addendum)
Of unclear etiology today. Denies possible dehydration. Few caffeinated beverages daily.  Recommended she monitor HR at home - taught how to check - and update me if persistently >100. Sounds regular today.  Trileptal has tachycardia listed as possible side effect, but doubt related to chronic med use.

## 2016-01-16 ENCOUNTER — Ambulatory Visit (INDEPENDENT_AMBULATORY_CARE_PROVIDER_SITE_OTHER): Payer: 59 | Admitting: Family Medicine

## 2016-01-16 ENCOUNTER — Encounter: Payer: Self-pay | Admitting: Family Medicine

## 2016-01-16 VITALS — BP 112/66 | HR 80 | Temp 98.9°F | Wt 93.5 lb

## 2016-01-16 DIAGNOSIS — Z0000 Encounter for general adult medical examination without abnormal findings: Secondary | ICD-10-CM | POA: Diagnosis not present

## 2016-01-16 DIAGNOSIS — G40909 Epilepsy, unspecified, not intractable, without status epilepticus: Secondary | ICD-10-CM

## 2016-01-16 LAB — COMPREHENSIVE METABOLIC PANEL
ALK PHOS: 45 U/L (ref 39–117)
ALT: 11 U/L (ref 0–35)
AST: 15 U/L (ref 0–37)
Albumin: 4.3 g/dL (ref 3.5–5.2)
BILIRUBIN TOTAL: 0.2 mg/dL (ref 0.2–1.2)
BUN: 18 mg/dL (ref 6–23)
CO2: 26 meq/L (ref 19–32)
CREATININE: 0.7 mg/dL (ref 0.40–1.20)
Calcium: 9.6 mg/dL (ref 8.4–10.5)
Chloride: 105 mEq/L (ref 96–112)
GFR: 129 mL/min (ref >60.00)
Glucose, Bld: 86 mg/dL (ref 70–99)
Potassium: 3.9 mEq/L (ref 3.5–5.1)
Sodium: 138 mEq/L (ref 135–145)
TOTAL PROTEIN: 7.6 g/dL (ref 6.0–8.3)

## 2016-01-16 LAB — CBC WITH DIFFERENTIAL/PLATELET
BASOS ABS: 0 10*3/uL (ref 0.0–0.1)
Basophils Relative: 0.4 % (ref 0.0–3.0)
EOS ABS: 0 10*3/uL (ref 0.0–0.7)
Eosinophils Relative: 0.6 % (ref 0.0–5.0)
HCT: 40.5 % (ref 36.0–46.0)
Hemoglobin: 13.4 g/dL (ref 12.0–15.0)
LYMPHS ABS: 1.8 10*3/uL (ref 0.7–4.0)
Lymphocytes Relative: 27.4 % (ref 12.0–46.0)
MCHC: 33.2 g/dL (ref 30.0–36.0)
MCV: 92.6 fl (ref 78.0–100.0)
MONOS PCT: 8.1 % (ref 3.0–12.0)
Monocytes Absolute: 0.5 10*3/uL (ref 0.1–1.0)
NEUTROS ABS: 4.2 10*3/uL (ref 1.4–7.7)
NEUTROS PCT: 63.5 % (ref 43.0–77.0)
Platelets: 305 10*3/uL (ref 150.0–400.0)
RBC: 4.38 Mil/uL (ref 3.87–5.11)
RDW: 13.3 % (ref 11.5–15.5)
WBC: 6.7 10*3/uL (ref 4.0–10.5)

## 2016-01-16 LAB — TSH: TSH: 1.63 u[IU]/mL (ref 0.35–4.50)

## 2016-01-16 MED ORDER — OXCARBAZEPINE 600 MG PO TABS: ORAL_TABLET | ORAL | Status: DC

## 2016-01-16 MED ORDER — WOMENS MULTIVITAMIN PLUS PO TABS: 1.0000 | ORAL_TABLET | Freq: Every day | ORAL | Status: DC

## 2016-01-16 NOTE — Addendum Note (Signed)
Addended by: Baldomero Lamy on: 01/16/2016 02:35 PM   Modules accepted: Kipp Brood

## 2016-01-16 NOTE — Assessment & Plan Note (Signed)
Chronic, stable. Continue trileptal. Check labs today.

## 2016-01-16 NOTE — Patient Instructions (Signed)
Blood work today You are doing well today Return as needed or in 1 year for next physical  Health Maintenance, Female Adopting a healthy lifestyle and getting preventive care can go a long way to promote health and wellness. Talk with your health care provider about what schedule of regular examinations is right for you. This is a good chance for you to check in with your provider about disease prevention and staying healthy. In between checkups, there are plenty of things you can do on your own. Experts have done a lot of research about which lifestyle changes and preventive measures are most likely to keep you healthy. Ask your health care provider for more information. WEIGHT AND DIET  Eat a healthy diet  Be sure to include plenty of vegetables, fruits, low-fat dairy products, and lean protein.  Do not eat a lot of foods high in solid fats, added sugars, or salt.  Get regular exercise. This is one of the most important things you can do for your health.  Most adults should exercise for at least 150 minutes each week. The exercise should increase your heart rate and make you sweat (moderate-intensity exercise).  Most adults should also do strengthening exercises at least twice a week. This is in addition to the moderate-intensity exercise.  Maintain a healthy weight  Body mass index (BMI) is a measurement that can be used to identify possible weight problems. It estimates body fat based on height and weight. Your health care provider can help determine your BMI and help you achieve or maintain a healthy weight.  For females 64 years of age and older:   A BMI below 18.5 is considered underweight.  A BMI of 18.5 to 24.9 is normal.  A BMI of 25 to 29.9 is considered overweight.  A BMI of 30 and above is considered obese.  Watch levels of cholesterol and blood lipids  You should start having your blood tested for lipids and cholesterol at 28 years of age, then have this test every  5 years.  You may need to have your cholesterol levels checked more often if:  Your lipid or cholesterol levels are high.  You are older than 28 years of age.  You are at high risk for heart disease.  CANCER SCREENING   Lung Cancer  Lung cancer screening is recommended for adults 98-20 years old who are at high risk for lung cancer because of a history of smoking.  A yearly low-dose CT scan of the lungs is recommended for people who:  Currently smoke.  Have quit within the past 15 years.  Have at least a 30-pack-year history of smoking. A pack year is smoking an average of one pack of cigarettes a day for 1 year.  Yearly screening should continue until it has been 15 years since you quit.  Yearly screening should stop if you develop a health problem that would prevent you from having lung cancer treatment.  Breast Cancer  Practice breast self-awareness. This means understanding how your breasts normally appear and feel.  It also means doing regular breast self-exams. Let your health care provider know about any changes, no matter how small.  If you are in your 20s or 30s, you should have a clinical breast exam (CBE) by a health care provider every 1-3 years as part of a regular health exam.  If you are 57 or older, have a CBE every year. Also consider having a breast X-ray (mammogram) every year.  If you  have a family history of breast cancer, talk to your health care provider about genetic screening.  If you are at high risk for breast cancer, talk to your health care provider about having an MRI and a mammogram every year.  Breast cancer gene (BRCA) assessment is recommended for women who have family members with BRCA-related cancers. BRCA-related cancers include:  Breast.  Ovarian.  Tubal.  Peritoneal cancers.  Results of the assessment will determine the need for genetic counseling and BRCA1 and BRCA2 testing. Cervical Cancer Your health care provider may  recommend that you be screened regularly for cancer of the pelvic organs (ovaries, uterus, and vagina). This screening involves a pelvic examination, including checking for microscopic changes to the surface of your cervix (Pap test). You may be encouraged to have this screening done every 3 years, beginning at age 50.  For women ages 72-65, health care providers may recommend pelvic exams and Pap testing every 3 years, or they may recommend the Pap and pelvic exam, combined with testing for human papilloma virus (HPV), every 5 years. Some types of HPV increase your risk of cervical cancer. Testing for HPV may also be done on women of any age with unclear Pap test results.  Other health care providers may not recommend any screening for nonpregnant women who are considered low risk for pelvic cancer and who do not have symptoms. Ask your health care provider if a screening pelvic exam is right for you.  If you have had past treatment for cervical cancer or a condition that could lead to cancer, you need Pap tests and screening for cancer for at least 20 years after your treatment. If Pap tests have been discontinued, your risk factors (such as having a new sexual partner) need to be reassessed to determine if screening should resume. Some women have medical problems that increase the chance of getting cervical cancer. In these cases, your health care provider may recommend more frequent screening and Pap tests. Colorectal Cancer  This type of cancer can be detected and often prevented.  Routine colorectal cancer screening usually begins at 28 years of age and continues through 28 years of age.  Your health care provider may recommend screening at an earlier age if you have risk factors for colon cancer.  Your health care provider may also recommend using home test kits to check for hidden blood in the stool.  A small camera at the end of a tube can be used to examine your colon directly  (sigmoidoscopy or colonoscopy). This is done to check for the earliest forms of colorectal cancer.  Routine screening usually begins at age 43.  Direct examination of the colon should be repeated every 5-10 years through 28 years of age. However, you may need to be screened more often if early forms of precancerous polyps or small growths are found. Skin Cancer  Check your skin from head to toe regularly.  Tell your health care provider about any new moles or changes in moles, especially if there is a change in a mole's shape or color.  Also tell your health care provider if you have a mole that is larger than the size of a pencil eraser.  Always use sunscreen. Apply sunscreen liberally and repeatedly throughout the day.  Protect yourself by wearing long sleeves, pants, a wide-brimmed hat, and sunglasses whenever you are outside. HEART DISEASE, DIABETES, AND HIGH BLOOD PRESSURE   High blood pressure causes heart disease and increases the risk of stroke.  High blood pressure is more likely to develop in:  People who have blood pressure in the high end of the normal range (130-139/85-89 mm Hg).  People who are overweight or obese.  People who are African American.  If you are 42-104 years of age, have your blood pressure checked every 3-5 years. If you are 60 years of age or older, have your blood pressure checked every year. You should have your blood pressure measured twice--once when you are at a hospital or clinic, and once when you are not at a hospital or clinic. Record the average of the two measurements. To check your blood pressure when you are not at a hospital or clinic, you can use:  An automated blood pressure machine at a pharmacy.  A home blood pressure monitor.  If you are between 18 years and 59 years old, ask your health care provider if you should take aspirin to prevent strokes.  Have regular diabetes screenings. This involves taking a blood sample to check your  fasting blood sugar level.  If you are at a normal weight and have a low risk for diabetes, have this test once every three years after 28 years of age.  If you are overweight and have a high risk for diabetes, consider being tested at a younger age or more often. PREVENTING INFECTION  Hepatitis B  If you have a higher risk for hepatitis B, you should be screened for this virus. You are considered at high risk for hepatitis B if:  You were born in a country where hepatitis B is common. Ask your health care provider which countries are considered high risk.  Your parents were born in a high-risk country, and you have not been immunized against hepatitis B (hepatitis B vaccine).  You have HIV or AIDS.  You use needles to inject street drugs.  You live with someone who has hepatitis B.  You have had sex with someone who has hepatitis B.  You get hemodialysis treatment.  You take certain medicines for conditions, including cancer, organ transplantation, and autoimmune conditions. Hepatitis C  Blood testing is recommended for:  Everyone born from [birth year] through 1963.  Anyone with known risk factors for hepatitis C. Sexually transmitted infections (STIs)  You should be screened for sexually transmitted infections (STIs) including gonorrhea and chlamydia if:  You are sexually active and are younger than 28 years of age.  You are older than 28 years of age and your health care provider tells you that you are at risk for this type of infection.  Your sexual activity has changed since you were last screened and you are at an increased risk for chlamydia or gonorrhea. Ask your health care provider if you are at risk.  If you do not have HIV, but are at risk, it may be recommended that you take a prescription medicine daily to prevent HIV infection. This is called pre-exposure prophylaxis (PrEP). You are considered at risk if:  You are sexually active and do not regularly use condoms or  know the HIV status of your partner(s).  You take drugs by injection.  You are sexually active with a partner who has HIV. Talk with your health care provider about whether you are at high risk of being infected with HIV. If you choose to begin PrEP, you should first be tested for HIV. You should then be tested every 3 months for as long as you are taking PrEP.  PREGNANCY   If you  are premenopausal and you may become pregnant, ask your health care provider about preconception counseling.  If you may become pregnant, take 400 to 800 micrograms (mcg) of folic acid every day.  If you want to prevent pregnancy, talk to your health care provider about birth control (contraception). OSTEOPOROSIS AND MENOPAUSE   Osteoporosis is a disease in which the bones lose minerals and strength with aging. This can result in serious bone fractures. Your risk for osteoporosis can be identified using a bone density scan.  If you are 86 years of age or older, or if you are at risk for osteoporosis and fractures, ask your health care provider if you should be screened.  Ask your health care provider whether you should take a calcium or vitamin D supplement to lower your risk for osteoporosis.  Menopause may have certain physical symptoms and risks.  Hormone replacement therapy may reduce some of these symptoms and risks. Talk to your health care provider about whether hormone replacement therapy is right for you.  HOME CARE INSTRUCTIONS   Schedule regular health, dental, and eye exams.  Stay current with your immunizations.   Do not use any tobacco products including cigarettes, chewing tobacco, or electronic cigarettes.  If you are pregnant, do not drink alcohol.  If you are breastfeeding, limit how much and how often you drink alcohol.  Limit alcohol intake to no more than 1 drink per day for nonpregnant women. One drink equals 12 ounces of beer, 5 ounces of wine, or 1 ounces of hard liquor.  Do  not use street drugs.  Do not share needles.  Ask your health care provider for help if you need support or information about quitting drugs.  Tell your health care provider if you often feel depressed.  Tell your health care provider if you have ever been abused or do not feel safe at home.   This information is not intended to replace advice given to you by your health care provider. Make sure you discuss any questions you have with your health care provider.   Document Released: 06/16/2011 Document Revised: 12/22/2014 Document Reviewed: 11/02/2013 Elsevier Interactive Patient Education [employer].

## 2016-01-16 NOTE — Progress Notes (Signed)
BP 112/66 mmHg  Pulse 80  Temp(Src) 98.9 F (37.2 C) (Oral)  Wt 93 lb 8 oz (42.411 kg)  LMP 12/27/2015   CC: CPE  Subjective:    Patient ID: Lori Ballard, female    DOB: [birth year], 30 y.o.   MRN: [id number]  HPI: Lori Ballard is a 30 y.o. 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 Also takes MVI daily. No seizure for years. Drives. Does all ADLs she needs to do.   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   LMP 12/27/2015. Regular cycles.  Not currently sexually active.   Preventative: Declines flu shot.  tetanus shot - declines  Well woman - saw Dr. Erin Fulling 2014.Will be due for rpt this year.  Seat belt use discussed No changing moles on skin  Caffeine: none Lives with parents, only child, 1 dog (shitzu) Occupation: works at [employer] Improvement Activity: no regular exercise - stays active at work Diet: good water, fruits/vegetables daily   Relevant past medical, surgical, family and social history reviewed and updated as indicated. Interim medical history since our last visit reviewed. Allergies and medications reviewed and updated. No current outpatient prescriptions on file prior to visit.   No current facility-administered medications on file prior to visit.    Review of Systems  Constitutional: Negative for fever, chills, activity change, appetite change, fatigue and unexpected weight change.  HENT: Negative for hearing loss.   Eyes: Negative for visual disturbance.  Respiratory: Negative for cough, chest tightness, shortness of breath and wheezing.   Cardiovascular: Negative for chest pain, palpitations and leg swelling.  Gastrointestinal: Negative for nausea, vomiting, abdominal pain, diarrhea, constipation, blood in stool and abdominal distention.  Genitourinary: Negative for hematuria and difficulty urinating.  Musculoskeletal: Negative for myalgias,  arthralgias and neck pain.  Skin: Negative for rash.  Neurological: Negative for dizziness, seizures, syncope and headaches.  Hematological: Negative for adenopathy. Does not bruise/bleed easily.  Psychiatric/Behavioral: Negative for dysphoric mood. The patient is not nervous/anxious.    Per HPI unless specifically indicated in ROS section     Objective:    BP 112/66 mmHg  Pulse 80  Temp(Src) 98.9 F (37.2 C) (Oral)  Wt 93 lb 8 oz (42.411 kg)  LMP 12/27/2015  Wt Readings from Last 3 Encounters:  01/16/16 93 lb 8 oz (42.411 kg)  11/26/15 92 lb (41.731 kg)  03/27/15 93 lb 6.4 oz (42.366 kg)   Body mass index is 18.87 kg/(m^2).  Physical Exam  Constitutional: She is oriented to person, place, and time. She appears well-developed and well-nourished. No distress.  thin  HENT:  Head: Normocephalic and atraumatic.  Right Ear: Hearing, tympanic membrane, external ear and ear canal normal.  Left Ear: Hearing, tympanic membrane, external ear and ear canal normal.  Nose: Nose normal.  Mouth/Throat: Uvula is midline, oropharynx is clear and moist and mucous membranes are normal. No oropharyngeal exudate, posterior oropharyngeal edema or posterior oropharyngeal erythema.  Eyes: Conjunctivae and EOM are normal. Pupils are equal, round, and reactive to light. No scleral icterus.  Neck: Normal range of motion. Neck supple. No thyromegaly present.  Cardiovascular: Normal rate, regular rhythm, normal heart sounds and intact distal pulses.   No murmur heard. Pulses:      Radial pulses are 2+ on the right side, and 2+ on the left side.  Pulmonary/Chest: Effort normal and breath sounds normal. No respiratory distress. She has no wheezes. She has  no rales.  Abdominal: Soft. Bowel sounds are normal. She exhibits no distension and no mass. There is no tenderness. There is no rebound and no guarding.  Musculoskeletal: Normal range of motion. She exhibits no edema.  Lymphadenopathy:    She has no  cervical adenopathy.  Neurological: She is alert and oriented to person, place, and time.  CN grossly intact, station and gait intact Chronic R sided hemiparesis  Skin: Skin is warm and dry. No rash noted.  Psychiatric: She has a normal mood and affect. Her behavior is normal. Judgment and thought content normal.  Nursing note and vitals reviewed.  Results for orders placed or performed in visit on 03/27/15  CBC with Differential/Platelet  Result Value Ref Range   WBC 5.2 4.0 - 10.5 K/uL   RBC 4.32 3.87 - 5.11 Mil/uL   Hemoglobin 13.2 12.0 - 15.0 g/dL   HCT 91.4 78.2 - 95.6 %   MCV 89.6 78.0 - 100.0 fl   MCHC 34.0 30.0 - 36.0 g/dL   RDW 21.3 08.6 - 57.8 %   Platelets 299.0 150.0 - 400.0 K/uL   Neutrophils Relative % 64.5 43.0 - 77.0 %   Lymphocytes Relative 27.9 12.0 - 46.0 %   Monocytes Relative 7.1 3.0 - 12.0 %   Eosinophils Relative 0.2 0.0 - 5.0 %   Basophils Relative 0.3 0.0 - 3.0 %   Neutro Abs 3.4 1.4 - 7.7 K/uL   Lymphs Abs 1.5 0.7 - 4.0 K/uL   Monocytes Absolute 0.4 0.1 - 1.0 K/uL   Eosinophils Absolute 0.0 0.0 - 0.7 K/uL   Basophils Absolute 0.0 0.0 - 0.1 K/uL  TSH  Result Value Ref Range   TSH 0.90 0.35 - 4.50 uIU/mL  Basic metabolic panel  Result Value Ref Range   Sodium 135 135 - 145 mEq/L   Potassium 3.8 3.5 - 5.1 mEq/L   Chloride 104 96 - 112 mEq/L   CO2 26 19 - 32 mEq/L   Glucose, Bld 80 70 - 99 mg/dL   BUN 13 6 - 23 mg/dL   Creatinine, Ser 4.69 0.40 - 1.20 mg/dL   Calcium 9.5 8.4 - 62.9 mg/dL   GFR 528.41 >32.44 mL/min      Assessment & Plan:  Discussed HIV screen but forgot to order - will check next lab draw. Problem List Items Addressed This Visit    Seizure disorder (HCC)    Chronic, stable. Continue trileptal. Check labs today.      Relevant Orders   TSH   Comprehensive metabolic panel   CBC with Differential/Platelet   Health maintenance examination - Primary    Preventative protocols reviewed and updated unless pt declined. Discussed  healthy diet and lifestyle.           Follow up plan: Return in about 1 year (around 01/15/2017), or as needed, for annual exam, prior fasting for blood work.

## 2016-01-16 NOTE — Progress Notes (Signed)
Pre visit review using our clinic review tool, if applicable. No additional management support is needed unless otherwise documented below in the visit note. 

## 2016-01-16 NOTE — Assessment & Plan Note (Addendum)
Preventative protocols reviewed and updated unless pt declined. Discussed healthy diet and lifestyle.  

## 2016-01-17 ENCOUNTER — Encounter: Payer: 59 | Admitting: Family Medicine

## 2016-01-21 ENCOUNTER — Encounter: Payer: Self-pay | Admitting: *Deleted

## 2016-05-21 ENCOUNTER — Telehealth: Payer: Self-pay | Admitting: *Deleted

## 2016-05-21 NOTE — Telephone Encounter (Signed)
noted 

## 2016-05-21 NOTE — Telephone Encounter (Signed)
Patient walked in with complaints of left low back pain that started yesterday.  She denies any abdominal pain or dysuria.  She does a lot of bending and lifting at work and feels like she may have injured herself there.  She has been taking Aleve with no change in symptoms.  She would like to be seen by PCP who has no availability today or tomorrow.  Patient aware and states she will wait to see another provider tomorrow rather than going to urgent care.  Appointment scheduled with Lori Ballard. Baity, NP for 0900 tomorrow 6/8.  Patient advised to try adding Tylenol and heat if no improvement with Aleve.

## 2016-05-22 ENCOUNTER — Encounter: Payer: Self-pay | Admitting: Internal Medicine

## 2016-05-22 ENCOUNTER — Ambulatory Visit (INDEPENDENT_AMBULATORY_CARE_PROVIDER_SITE_OTHER): Payer: 59 | Admitting: Internal Medicine

## 2016-05-22 VITALS — BP 92/60 | HR 96 | Temp 98.5°F | Wt 93.5 lb

## 2016-05-22 DIAGNOSIS — S29012A Strain of muscle and tendon of back wall of thorax, initial encounter: Secondary | ICD-10-CM | POA: Diagnosis not present

## 2016-05-22 DIAGNOSIS — M546 Pain in thoracic spine: Secondary | ICD-10-CM | POA: Diagnosis not present

## 2016-05-22 MED ORDER — METHOCARBAMOL 500 MG PO TABS: 500.0000 mg | ORAL_TABLET | Freq: Every evening | ORAL | Status: DC | PRN

## 2016-05-22 NOTE — Patient Instructions (Signed)

## 2016-05-22 NOTE — Progress Notes (Signed)
Subjective:    Patient ID: Lori Ballard, female    DOB: Dec 03, 1986, 30 y.o.   MRN: [id number]  HPI  Pt presents to the clinic today with c/o left sided back pain. This started 2 days ago while at work. She works at [employer] and does not remember any particular injury to the area. She describes the pain as sore and achy. It does not radiate. The pain is worse with movement. She denies urinary or vaginal complaints. Her bowels are moving normally. She has been taking Ibuprofen, Aleve and using a heating pad with minimal relief. 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  Review of Systems      Past Medical History  Diagnosis Date  . 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 Multiple Vitamins-Minerals (WOMENS MULTIVITAMIN PLUS) TABS Take 1 tablet by mouth daily. 90 tablet 3  . oxcarbazepine (TRILEPTAL) 600 MG tablet TAKE ONE AND ONE-HALF TABLETS TWICE A DAY 270 tablet 3   No current facility-administered medications for this visit.    No Known Allergies  Family History  Problem Relation Age of Onset  . Cancer Maternal Grandfather     lung (smoker)  . Heart disease Paternal Grandfather   . Diabetes Neg Hx   . Stroke Neg Hx   . Hyperlipidemia Neg Hx   . Hypertension Neg Hx     Social History   Social History  . Marital Status: Single    Spouse Name: N/A  . Number of Children: N/A  . Years of Education: 12th gr   Occupational History  .     Social History Main Topics  . Smoking status: Never Smoker   . Smokeless tobacco: Never Used  . Alcohol Use: No  . Drug Use: No  . Sexual Activity:    Partners: Male    Birth Control/ Protection: Condom   Other Topics Concern  . Not on file   Social History Narrative   Caffeine: none   Lives with parents, only child, 1 dog.    Occupation: works at [employer]   Thinking about going back to school, [employer] nursing.   Activity: no regular exercise - stays active at work   Diet: good water, fruits/vegetables dialy     Constitutional: Denies fever, malaise, fatigue, headache or abrupt weight changes.  Gastrointestinal: Denies abdominal pain, bloating, constipation, diarrhea or blood in the stool.  GU: Denies urgency, frequency, pain with urination, burning sensation, blood in urine, odor or discharge. Musculoskeletal: Pt reports left side back pain. Denies decrease in range of motion, difficulty with gait, or joint pain and swelling.    No other specific complaints in a complete review of systems (except as listed in HPI above).  Objective:   Physical Exam   BP 92/60 mmHg  Pulse 96  Temp(Src) 98.5 F (36.9 C) (Oral)  Wt 93 lb 8 oz (42.411 kg) Wt Readings from Last 3 Encounters:  05/22/16 93 lb 8 oz (42.411 kg)  01/16/16 93 lb 8 oz (42.411 kg)  11/26/15 92 lb (41.731 kg)    General: Appears her stated age, well developed, well nourished in NAD. Abdomen: Soft and nontender.  Musculoskeletal: Normal flexion, extension and rotation of the spine, but painful. No bony tenderness noted over the spine.  Pain with palpation of the left parathoracic muscles. Strength unequal L>R.   BMET    Component Value Date/Time   NA 138 01/16/2016 0759   K 3.9 01/16/2016 0759   CL 105 01/16/2016 0759   CO2 26 01/16/2016 0759   GLUCOSE 86 01/16/2016 0759   BUN 18 01/16/2016 0759   CREATININE 0.70 01/16/2016 0759   CALCIUM 9.6 01/16/2016 0759    Lipid Panel     Component Value Date/Time   CHOL 184 04/07/2011 1114   TRIG 42.0 04/07/2011 1114   HDL 63.50 04/07/2011 1114   CHOLHDL 3 04/07/2011 1114   VLDL 8.4 04/07/2011 1114   LDLCALC 112* 04/07/2011 1114    CBC    Component Value Date/Time   WBC 6.7 01/16/2016 0759   RBC 4.38 01/16/2016 0759   HGB 13.4 01/16/2016 0759   HCT 40.5 01/16/2016 0759    PLT 305.0 01/16/2016 0759   MCV 92.6 01/16/2016 0759   MCHC 33.2 01/16/2016 0759   RDW 13.3 01/16/2016 0759   LYMPHSABS 1.8 01/16/2016 0759   MONOABS 0.5 01/16/2016 0759   EOSABS 0.0 01/16/2016 0759   BASOSABS 0.0 01/16/2016 0759    Hgb A1C No results found for: HGBA1C      Assessment & Plan:   Left side thoracic back pain secondary to muscle strain:  Advised her not to take Aleve and Ibuprofen together, because they are both NSAID's Advised Ibuprofen 400-600 mg TID prn with meals Continue heating pad eRx for Robaxin 500 mg QHS prn Stretching exercises given Return precautions given Work note provided  RTC as needed or if symptoms persist or worsen  RTC as needed

## 2016-05-22 NOTE — Progress Notes (Signed)
Pre visit review using our clinic review tool, if applicable. No additional management support is needed unless otherwise documented below in the visit note. 

## 2016-10-24 ENCOUNTER — Encounter: Payer: Self-pay | Admitting: Family Medicine

## 2016-10-24 ENCOUNTER — Ambulatory Visit (INDEPENDENT_AMBULATORY_CARE_PROVIDER_SITE_OTHER): Payer: 59 | Admitting: Family Medicine

## 2016-10-24 ENCOUNTER — Telehealth: Payer: Self-pay

## 2016-10-24 VITALS — BP 104/70 | HR 102 | Temp 98.9°F | Wt 96.4 lb

## 2016-10-24 DIAGNOSIS — R52 Pain, unspecified: Secondary | ICD-10-CM

## 2016-10-24 LAB — POCT INFLUENZA A/B
INFLUENZA A, POC: NEGATIVE
INFLUENZA B, POC: NEGATIVE

## 2016-10-24 NOTE — Assessment & Plan Note (Signed)
New problem. Suspect viral illness. Flu negative. Advised supportive care with PRN Tylenol/Ibuprofen, rest, fluids.

## 2016-10-24 NOTE — Progress Notes (Signed)
   Subjective:  Patient ID: Lori Ballard, female    DOB: 29-Sep-1986  Age: 30 y.o. MRN: [id number]  CC: Body aches, chills  HPI:  30 year old female presents with the above complaint.  Patient reports she's not been feeling well for the past few days. Moderate in severity. She has been experiencing body aches and chills. No reported respiratory symptoms. No shortness of breath. No chest pain. No reports of any other associated symptoms. She states that it seems to be worse when she is out in the cold. No no relieving factors. No other complaints or issues at this time.  Social Hx   Social History   Social History  . Marital status: Single    Spouse name: N/A  . Number of children: N/A  . Years of education: 12th gr   Occupational History  .  Unemployed   Social History Main Topics  . Smoking status: Never Smoker  . Smokeless tobacco: Never Used  . Alcohol use No  . Drug use: No  . Sexual activity: Yes    Partners: Male    Birth control/ protection: Condom   Other Topics Concern  . None   Social History Narrative   Caffeine: none   Lives with parents, only child, 1 dog.   Occupation: works at [employer]   Thinking about going back to school, [employer] nursing.   Activity: no regular exercise - stays active at work   Diet: good water, fruits/vegetables dialy    Review of Systems  Constitutional: Positive for chills. Negative for fatigue.  Musculoskeletal:       Body aches.    Objective:  BP 104/70 (BP Location: Right Arm, Patient Position: Sitting, Cuff Size: Normal)   Pulse (!) 102   Temp 98.9 F (37.2 C) (Oral)   Wt 96 lb 6 oz (43.7 kg)   SpO2 99%   BMI 19.47 kg/m   BP/Weight 10/24/2016 05/22/2016 01/16/2016  Systolic BP 104 92 112  Diastolic BP 70 60 66  Wt. (Lbs) 96.38 93.5 93.5  BMI 19.47 18.87 18.87   Physical Exam  Constitutional: She is oriented to person, place, and time. She appears well-developed. No distress.  HENT:  Mouth/Throat:  Oropharynx is clear and moist.  Eyes: Conjunctivae are normal.  Neck: Neck supple.  Cardiovascular: Regular rhythm.  Tachycardia present.   Pulmonary/Chest: Effort normal. She has no wheezes. She has no rales.  Neurological: She is alert and oriented to person, place, and time.  Vitals reviewed.  Lab Results  Component Value Date   WBC 6.7 01/16/2016   HGB 13.4 01/16/2016   HCT 40.5 01/16/2016   PLT 305.0 01/16/2016   GLUCOSE 86 01/16/2016   CHOL 184 04/07/2011   TRIG 42.0 04/07/2011   HDL 63.50 04/07/2011   LDLCALC 112 (H) 04/07/2011   ALT 11 01/16/2016   AST 15 01/16/2016   NA 138 01/16/2016   K 3.9 01/16/2016   CL 105 01/16/2016   CREATININE 0.70 01/16/2016   BUN 18 01/16/2016   CO2 26 01/16/2016   TSH 1.63 01/16/2016    Assessment & Plan:   Problem List Items Addressed This Visit    Body aches - Primary    New problem. Suspect viral illness. Flu negative. Advised supportive care with PRN Tylenol/Ibuprofen, rest, fluids.      Relevant Orders   POCT Influenza A/B (Completed)     Follow-up: PRN  Everlene OtherJayce Laquitha Heslin DO [facility]

## 2016-10-24 NOTE — Patient Instructions (Signed)
This is likely a viral illness. Flu was negative.  Ibuprofen and/or tylenol as needed.  Rest, fluids.  Follow up if you fail to improve or worsen.  Take care  Dr Adriana Simasook

## 2016-10-24 NOTE — Telephone Encounter (Signed)
Pt is going to get new pharmacy and wants rx for Trileptal; advised pt to have new pharmacy contact [facility] in [place] and have available refills transferred, pt voiced understanding.

## 2016-10-24 NOTE — Progress Notes (Signed)
Pre visit review using our clinic review tool, if applicable. No additional management support is needed unless otherwise documented below in the visit note. 

## 2016-10-31 ENCOUNTER — Telehealth: Payer: Self-pay | Admitting: *Deleted

## 2016-10-31 NOTE — Telephone Encounter (Signed)
In your IN box for completion.  

## 2016-10-31 NOTE — Telephone Encounter (Signed)
PT brought in handicap placard renewal form. Please fill it out and get it back to patient. Call back number [phone]. Form placed in prescription tower.

## 2016-10-31 NOTE — Telephone Encounter (Signed)
filled and in Kim's box. 

## 2016-10-31 NOTE — Telephone Encounter (Signed)
Message left advising patient and paperwork placed up front for pick up.

## 2017-01-19 ENCOUNTER — Encounter: Payer: 59 | Admitting: Family Medicine

## 2017-01-23 ENCOUNTER — Other Ambulatory Visit: Payer: Self-pay | Admitting: Family Medicine

## 2017-01-23 NOTE — Telephone Encounter (Signed)
Pt request refill Trileptal. Advised already refilled at [facility]. Pt will ck with pharmacy.

## 2017-02-02 ENCOUNTER — Encounter: Payer: Self-pay | Admitting: Family Medicine

## 2017-02-02 ENCOUNTER — Ambulatory Visit (INDEPENDENT_AMBULATORY_CARE_PROVIDER_SITE_OTHER): Payer: 59 | Admitting: Family Medicine

## 2017-02-02 VITALS — BP 96/76 | HR 88 | Temp 98.4°F | Ht 59.75 in | Wt 86.8 lb

## 2017-02-02 DIAGNOSIS — Z0000 Encounter for general adult medical examination without abnormal findings: Secondary | ICD-10-CM | POA: Diagnosis not present

## 2017-02-02 DIAGNOSIS — G40909 Epilepsy, unspecified, not intractable, without status epilepticus: Secondary | ICD-10-CM | POA: Diagnosis not present

## 2017-02-02 DIAGNOSIS — Z13220 Encounter for screening for lipoid disorders: Secondary | ICD-10-CM | POA: Diagnosis not present

## 2017-02-02 DIAGNOSIS — R636 Underweight: Secondary | ICD-10-CM | POA: Diagnosis not present

## 2017-02-02 MED ORDER — OXCARBAZEPINE 600 MG PO TABS: 900.0000 mg | ORAL_TABLET | Freq: Two times a day (BID) | ORAL | 3 refills | Status: DC

## 2017-02-02 NOTE — Progress Notes (Signed)
Pre visit review using our clinic review tool, if applicable. No additional management support is needed unless otherwise documented below in the visit note. 

## 2017-02-02 NOTE — Progress Notes (Signed)
BP 96/76 (BP Location: Right Arm, Cuff Size: Normal)   Pulse 88   Temp 98.4 F (36.9 C) (Oral)   Ht 4' 11.75" (1.518 m)   Wt 86 lb 12.8 oz (39.4 kg)   LMP 01/02/2017 (Exact Date)   SpO2 99%   BMI 17.09 kg/m    CC: CPE Subjective:    Patient ID: Lori Ballard, female    DOB: May 20, 1986, 31 y.o.   MRN: [id number]  HPI: Lori Ballard is a 31 y.o. female presenting on 02/02/2017 for Annual Exam   Congenital seizures - well controlled on trileptal 900mg  bid. Also takes MVI daily. Had seizure last year 06/2016 - thinks may have missed a dose. Drives. Does all ADLs she needs to do.  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   Preventative: Well woman - saw Dr. Erin Fulling 2014.Due to schedule  LMP 01/02/2017. Regular cycles. Not heavy bleeding.  Declines flu shot.  tetanus shot - declines  Seat belt use discussed Sunscreen use discussed. No changing moles on skin Non smoker Alcohol - none  Caffeine: none Lives with parents, only child, 1 dog (shitzu) Occupation: works at [employer]  Activity: no regular exercise - stays active at work  Diet: good water, fruits/vegetables daily, 3 meals a day, good appetite.   Relevant past medical, surgical, family and social history reviewed and updated as indicated. Interim medical history since our last visit reviewed. Allergies and medications reviewed and updated.  Outpatient Medications Prior to Visit  Medication Sig Dispense Refill  . Multiple Vitamins-Minerals (WOMENS MULTIVITAMIN PLUS) TABS Take 1 tablet by mouth daily. 90 tablet 3  . methocarbamol (ROBAXIN) 500 MG tablet Take 1 tablet (500 mg total) by mouth at bedtime as needed for muscle spasms. 20 tablet 0  . oxcarbazepine (TRILEPTAL) 600 MG tablet TAKE ONE AND ONE-HALF TABLETS TWICE A DAY 90 tablet 0   No facility-administered medications prior to visit.      Per HPI unless specifically indicated in ROS section below Review of Systems    Constitutional: Negative for activity change, appetite change, chills, fatigue, fever and unexpected weight change.  HENT: Positive for congestion, sinus pressure and sneezing. Negative for hearing loss.        Cold last month  Eyes: Negative for visual disturbance.  Respiratory: Positive for cough. Negative for chest tightness, shortness of breath and wheezing.   Cardiovascular: Negative for chest pain, palpitations and leg swelling.  Gastrointestinal: Negative for abdominal distention, abdominal pain, blood in stool, constipation, diarrhea, nausea and vomiting.  Genitourinary: Negative for difficulty urinating and hematuria.  Musculoskeletal: Negative for arthralgias, myalgias and neck pain.  Skin: Negative for rash.  Neurological: Negative for dizziness, seizures, syncope and headaches.  Hematological: Negative for adenopathy. Does not bruise/bleed easily.  Psychiatric/Behavioral: Negative for dysphoric mood. The patient is not nervous/anxious.        Objective:    BP 96/76 (BP Location: Right Arm, Cuff Size: Normal)   Pulse 88   Temp 98.4 F (36.9 C) (Oral)   Ht 4' 11.75" (1.518 m)   Wt 86 lb 12.8 oz (39.4 kg)   LMP 01/02/2017 (Exact Date)   SpO2 99%   BMI 17.09 kg/m   Wt Readings from Last 3 Encounters:  02/02/17 86 lb 12.8 oz (39.4 kg)  10/24/16 96 lb 6 oz (43.7 kg)  05/22/16 93 lb 8 oz (42.4 kg)    Physical Exam  Constitutional: She is oriented to person, place, and time. She appears well-developed  and well-nourished. No distress.  thin  HENT:  Head: Normocephalic and atraumatic.  Right Ear: Hearing, tympanic membrane, external ear and ear canal normal.  Left Ear: Hearing, tympanic membrane, external ear and ear canal normal.  Nose: Nose normal.  Mouth/Throat: Uvula is midline, oropharynx is clear and moist and mucous membranes are normal. No oropharyngeal exudate, posterior oropharyngeal edema or posterior oropharyngeal erythema.  Eyes: Conjunctivae and EOM are  normal. Pupils are equal, round, and reactive to light. No scleral icterus.  Neck: Normal range of motion. Neck supple. No thyromegaly present.  Cardiovascular: Normal rate, regular rhythm, normal heart sounds and intact distal pulses.   No murmur heard. Pulses:      Radial pulses are 2+ on the right side, and 2+ on the left side.  Pulmonary/Chest: Effort normal and breath sounds normal. No respiratory distress. She has no wheezes. She has no rales.  Abdominal: Soft. Bowel sounds are normal. She exhibits no distension and no mass. There is no tenderness. There is no rebound and no guarding.  Musculoskeletal: Normal range of motion. She exhibits no edema.  Lymphadenopathy:    She has no cervical adenopathy.  Neurological: She is alert and oriented to person, place, and time.  CN grossly intact, station and gait intact  Skin: Skin is warm and dry. No rash noted.  Psychiatric: She has a normal mood and affect. Her behavior is normal. Judgment and thought content normal.  Nursing note and vitals reviewed.     Assessment & Plan:   Problem List Items Addressed This Visit    Health maintenance examination - Primary    Preventative protocols reviewed and updated unless pt declined. Discussed healthy diet and lifestyle.       Seizure disorder [facility](HCC)    She does endorse having seizure 06/2016 but none since. May have missed trileptal dose. Will continue current dose 900mg  BID. Refilled today. Discussed if recurrent seizure to please let me know for further eval.       Relevant Orders   Comprehensive metabolic panel   TSH   CBC with Differential/Platelet   Underweight    Discussed weight loss noted.  Pt endorses 3 meals a day, good appetite. If no weight gain noted, recommended she start nutritional supplement like boost or ensure.  Pt agrees with plan.  No signs of disordered eating. Cycles regular. Check labs today.       Relevant Orders   Comprehensive metabolic panel   TSH   CBC  with Differential/Platelet    Other Visit Diagnoses    Lipid screening       Relevant Orders   Lipid panel       Follow up plan: Return in about 1 year (around 02/02/2018) for annual exam, prior fasting for blood work.  Eustaquio BoydenJavier Dickie Labarre, MD

## 2017-02-02 NOTE — Assessment & Plan Note (Signed)
Preventative protocols reviewed and updated unless pt declined. Discussed healthy diet and lifestyle.  

## 2017-02-02 NOTE — Assessment & Plan Note (Addendum)
Discussed weight loss noted.  Pt endorses 3 meals a day, good appetite. If no weight gain noted, recommended she start nutritional supplement like boost or ensure.  Pt agrees with plan.  No signs of disordered eating. Cycles regular. Check labs today.

## 2017-02-02 NOTE — Patient Instructions (Addendum)
Call Dr Tamala Julian for well woman exam. If no weight gain despite trying, consider supplementing with boost or ensure.  Labs today.  Return as needed or in 1 year for next physical.   Health Maintenance, Female Introduction Adopting a healthy lifestyle and getting preventive care can go a long way to promote health and wellness. Talk with your health care provider about what schedule of regular examinations is right for you. This is a good chance for you to check in with your provider about disease prevention and staying healthy. In between checkups, there are plenty of things you can do on your own. Experts have done a lot of research about which lifestyle changes and preventive measures are most likely to keep you healthy. Ask your health care provider for more information. Weight and diet Eat a healthy diet  Be sure to include plenty of vegetables, fruits, low-fat dairy products, and lean protein.  Do not eat a lot of foods high in solid fats, added sugars, or salt.  Get regular exercise. This is one of the most important things you can do for your health.  Most adults should exercise for at least 150 minutes each week. The exercise should increase your heart rate and make you sweat (moderate-intensity exercise).  Most adults should also do strengthening exercises at least twice a week. This is in addition to the moderate-intensity exercise. Maintain a healthy weight  Body mass index (BMI) is a measurement that can be used to identify possible weight problems. It estimates body fat based on height and weight. Your health care provider can help determine your BMI and help you achieve or maintain a healthy weight.  For females 60 years of age and older:  A BMI below 18.5 is considered underweight.  A BMI of 18.5 to 24.9 is normal.  A BMI of 25 to 29.9 is considered overweight.  A BMI of 30 and above is considered obese. Watch levels of cholesterol and blood lipids  You should start  having your blood tested for lipids and cholesterol at 29 years of age, then have this test every 5 years.  You may need to have your cholesterol levels checked more often if:  Your lipid or cholesterol levels are high.  You are older than 29 years of age.  You are at high risk for heart disease. Cancer screening Lung Cancer  Lung cancer screening is recommended for adults 88-82 years old who are at high risk for lung cancer because of a history of smoking.  A yearly low-dose CT scan of the lungs is recommended for people who:  Currently smoke.  Have quit within the past 15 years.  Have at least a 30-pack-year history of smoking. A pack year is smoking an average of one pack of cigarettes a day for 1 year.  Yearly screening should continue until it has been 15 years since you quit.  Yearly screening should stop if you develop a health problem that would prevent you from having lung cancer treatment. Breast Cancer  Practice breast self-awareness. This means understanding how your breasts normally appear and feel.  It also means doing regular breast self-exams. Let your health care provider know about any changes, no matter how small.  If you are in your 20s or 30s, you should have a clinical breast exam (CBE) by a health care provider every 1-3 years as part of a regular health exam.  If you are 77 or older, have a CBE every year. Also consider having  a breast X-ray (mammogram) every year.  If you have a family history of breast cancer, talk to your health care provider about genetic screening.  If you are at high risk for breast cancer, talk to your health care provider about having an MRI and a mammogram every year.  Breast cancer gene (BRCA) assessment is recommended for women who have family members with BRCA-related cancers. BRCA-related cancers include:  Breast.  Ovarian.  Tubal.  Peritoneal cancers.  Results of the assessment will determine the need for genetic  counseling and BRCA1 and BRCA2 testing. Cervical Cancer  Your health care provider may recommend that you be screened regularly for cancer of the pelvic organs (ovaries, uterus, and vagina). This screening involves a pelvic examination, including checking for microscopic changes to the surface of your cervix (Pap test). You may be encouraged to have this screening done every 3 years, beginning at age 10.  For women ages 48-65, health care providers may recommend pelvic exams and Pap testing every 3 years, or they may recommend the Pap and pelvic exam, combined with testing for human papilloma virus (HPV), every 5 years. Some types of HPV increase your risk of cervical cancer. Testing for HPV may also be done on women of any age with unclear Pap test results.  Other health care providers may not recommend any screening for nonpregnant women who are considered low risk for pelvic cancer and who do not have symptoms. Ask your health care provider if a screening pelvic exam is right for you.  If you have had past treatment for cervical cancer or a condition that could lead to cancer, you need Pap tests and screening for cancer for at least 20 years after your treatment. If Pap tests have been discontinued, your risk factors (such as having a new sexual partner) need to be reassessed to determine if screening should resume. Some women have medical problems that increase the chance of getting cervical cancer. In these cases, your health care provider may recommend more frequent screening and Pap tests. Colorectal Cancer  This type of cancer can be detected and often prevented.  Routine colorectal cancer screening usually begins at 29 years of age and continues through 29 years of age.  Your health care provider may recommend screening at an earlier age if you have risk factors for colon cancer.  Your health care provider may also recommend using home test kits to check for hidden blood in the stool.  A  small camera at the end of a tube can be used to examine your colon directly (sigmoidoscopy or colonoscopy). This is done to check for the earliest forms of colorectal cancer.  Routine screening usually begins at age 57.  Direct examination of the colon should be repeated every 5-10 years through 29 years of age. However, you may need to be screened more often if early forms of precancerous polyps or small growths are found. Skin Cancer  Check your skin from head to toe regularly.  Tell your health care provider about any new moles or changes in moles, especially if there is a change in a mole's shape or color.  Also tell your health care provider if you have a mole that is larger than the size of a pencil eraser.  Always use sunscreen. Apply sunscreen liberally and repeatedly throughout the day.  Protect yourself by wearing long sleeves, pants, a wide-brimmed hat, and sunglasses whenever you are outside. Heart disease, diabetes, and high blood pressure  High blood pressure  causes heart disease and increases the risk of stroke. High blood pressure is more likely to develop in:  People who have blood pressure in the high end of the normal range (130-139/85-89 mm Hg).  People who are overweight or obese.  People who are African American.  If you are 23-25 years of age, have your blood pressure checked every 3-5 years. If you are 58 years of age or older, have your blood pressure checked every year. You should have your blood pressure measured twice-once when you are at a hospital or clinic, and once when you are not at a hospital or clinic. Record the average of the two measurements. To check your blood pressure when you are not at a hospital or clinic, you can use:  An automated blood pressure machine at a pharmacy.  A home blood pressure monitor.  If you are between 46 years and 46 years old, ask your health care provider if you should take aspirin to prevent strokes.  Have regular  diabetes screenings. This involves taking a blood sample to check your fasting blood sugar level.  If you are at a normal weight and have a low risk for diabetes, have this test once every three years after 29 years of age.  If you are overweight and have a high risk for diabetes, consider being tested at a younger age or more often. Preventing infection Hepatitis B  If you have a higher risk for hepatitis B, you should be screened for this virus. You are considered at high risk for hepatitis B if:  You were born in a country where hepatitis B is common. Ask your health care provider which countries are considered high risk.  Your parents were born in a high-risk country, and you have not been immunized against hepatitis B (hepatitis B vaccine).  You have HIV or AIDS.  You use needles to inject street drugs.  You live with someone who has hepatitis B.  You have had sex with someone who has hepatitis B.  You get hemodialysis treatment.  You take certain medicines for conditions, including cancer, organ transplantation, and autoimmune conditions. Hepatitis C  Blood testing is recommended for:  Everyone born from [birth year] through 1963.  Anyone with known risk factors for hepatitis C. Sexually transmitted infections (STIs)  You should be screened for sexually transmitted infections (STIs) including gonorrhea and chlamydia if:  You are sexually active and are younger than 29 years of age.  You are older than 29 years of age and your health care provider tells you that you are at risk for this type of infection.  Your sexual activity has changed since you were last screened and you are at an increased risk for chlamydia or gonorrhea. Ask your health care provider if you are at risk.  If you do not have HIV, but are at risk, it may be recommended that you take a prescription medicine daily to prevent HIV infection. This is called pre-exposure prophylaxis (PrEP). You are considered at  risk if:  You are sexually active and do not regularly use condoms or know the HIV status of your partner(s).  You take drugs by injection.  You are sexually active with a partner who has HIV. Talk with your health care provider about whether you are at high risk of being infected with HIV. If you choose to begin PrEP, you should first be tested for HIV. You should then be tested every 3 months for as long as you are  taking PrEP. Pregnancy  If you are premenopausal and you may become pregnant, ask your health care provider about preconception counseling.  If you may become pregnant, take 400 to 800 micrograms (mcg) of folic acid every day.  If you want to prevent pregnancy, talk to your health care provider about birth control (contraception). Osteoporosis and menopause  Osteoporosis is a disease in which the bones lose minerals and strength with aging. This can result in serious bone fractures. Your risk for osteoporosis can be identified using a bone density scan.  If you are 8 years of age or older, or if you are at risk for osteoporosis and fractures, ask your health care provider if you should be screened.  Ask your health care provider whether you should take a calcium or vitamin D supplement to lower your risk for osteoporosis.  Menopause may have certain physical symptoms and risks.  Hormone replacement therapy may reduce some of these symptoms and risks. Talk to your health care provider about whether hormone replacement therapy is right for you. Follow these instructions at home:  Schedule regular health, dental, and eye exams.  Stay current with your immunizations.  Do not use any tobacco products including cigarettes, chewing tobacco, or electronic cigarettes.  If you are pregnant, do not drink alcohol.  If you are breastfeeding, limit how much and how often you drink alcohol.  Limit alcohol intake to no more than 1 drink per day for nonpregnant women. One drink  equals 12 ounces of beer, 5 ounces of wine, or 1 ounces of hard liquor.  Do not use street drugs.  Do not share needles.  Ask your health care provider for help if you need support or information about quitting drugs.  Tell your health care provider if you often feel depressed.  Tell your health care provider if you have ever been abused or do not feel safe at home. This information is not intended to replace advice given to you by your health care provider. Make sure you discuss any questions you have with your health care provider. Document Released: 06/16/2011 Document Revised: 05/08/2016 Document Reviewed: 09/04/2015  2017 [employer]vier

## 2017-02-02 NOTE — Assessment & Plan Note (Signed)
She does endorse having seizure 06/2016 but none since. May have missed trileptal dose. Will continue current dose 900mg  BID. Refilled today. Discussed if recurrent seizure to please let me know for further eval.

## 2017-02-03 LAB — CBC WITH DIFFERENTIAL/PLATELET
BASOS ABS: 0.1 10*3/uL (ref 0.0–0.1)
Basophils Relative: 0.6 % (ref 0.0–3.0)
EOS ABS: 0 10*3/uL (ref 0.0–0.7)
Eosinophils Relative: 0.2 % (ref 0.0–5.0)
HCT: 36.9 % (ref 36.0–46.0)
Hemoglobin: 12.4 g/dL (ref 12.0–15.0)
LYMPHS ABS: 1.7 10*3/uL (ref 0.7–4.0)
Lymphocytes Relative: 21.6 % (ref 12.0–46.0)
MCHC: 33.7 g/dL (ref 30.0–36.0)
MCV: 91.7 fl (ref 78.0–100.0)
Monocytes Absolute: 0.6 10*3/uL (ref 0.1–1.0)
Monocytes Relative: 7.8 % (ref 3.0–12.0)
NEUTROS ABS: 5.6 10*3/uL (ref 1.4–7.7)
NEUTROS PCT: 69.8 % (ref 43.0–77.0)
PLATELETS: 305 10*3/uL (ref 150.0–400.0)
RBC: 4.02 Mil/uL (ref 3.87–5.11)
RDW: 13 % (ref 11.5–15.5)
WBC: 8 10*3/uL (ref 4.0–10.5)

## 2017-02-03 LAB — COMPREHENSIVE METABOLIC PANEL
ALBUMIN: 4.4 g/dL (ref 3.5–5.2)
ALK PHOS: 35 U/L — AB (ref 39–117)
ALT: 8 U/L (ref 0–35)
AST: 14 U/L (ref 0–37)
BUN: 15 mg/dL (ref 6–23)
CO2: 24 mEq/L (ref 19–32)
Calcium: 9.2 mg/dL (ref 8.4–10.5)
Chloride: 105 mEq/L (ref 96–112)
Creatinine, Ser: 0.68 mg/dL (ref 0.40–1.20)
GFR: 132.36 mL/min (ref >60.00)
Glucose, Bld: 69 mg/dL — ABNORMAL LOW (ref 70–99)
POTASSIUM: 4 meq/L (ref 3.5–5.1)
SODIUM: 135 meq/L (ref 135–145)
TOTAL PROTEIN: 7.3 g/dL (ref 6.0–8.3)
Total Bilirubin: 0.2 mg/dL (ref 0.2–1.2)

## 2017-02-03 LAB — LIPID PANEL
CHOLESTEROL: 158 mg/dL (ref 0–200)
HDL: 58.6 mg/dL (ref >39.00)
LDL Cholesterol: 89 mg/dL (ref 0–99)
NonHDL: 99.62
Total CHOL/HDL Ratio: 3
Triglycerides: 51 mg/dL (ref 0.0–149.0)
VLDL: 10.2 mg/dL (ref 0.0–40.0)

## 2017-02-03 LAB — TSH: TSH: 0.85 u[IU]/mL (ref 0.35–4.50)

## 2017-02-09 ENCOUNTER — Encounter: Payer: Self-pay | Admitting: *Deleted

## 2017-08-05 ENCOUNTER — Encounter: Payer: 59 | Admitting: Family Medicine

## 2017-11-17 ENCOUNTER — Other Ambulatory Visit: Payer: Self-pay | Admitting: Osteopathic Medicine

## 2017-11-17 DIAGNOSIS — R102 Pelvic and perineal pain: Secondary | ICD-10-CM

## 2017-11-20 ENCOUNTER — Ambulatory Visit: Payer: BLUE CROSS/BLUE SHIELD

## 2018-01-12 ENCOUNTER — Ambulatory Visit: Payer: BLUE CROSS/BLUE SHIELD | Admitting: Obstetrics & Gynecology

## 2018-01-25 ENCOUNTER — Encounter: Payer: Self-pay | Admitting: Obstetrics & Gynecology

## 2018-01-25 ENCOUNTER — Ambulatory Visit (INDEPENDENT_AMBULATORY_CARE_PROVIDER_SITE_OTHER): Payer: BLUE CROSS/BLUE SHIELD | Admitting: Obstetrics & Gynecology

## 2018-01-25 VITALS — BP 102/72 | HR 82 | Wt 92.3 lb

## 2018-01-25 DIAGNOSIS — Z01419 Encounter for gynecological examination (general) (routine) without abnormal findings: Secondary | ICD-10-CM

## 2018-01-25 DIAGNOSIS — Z3009 Encounter for other general counseling and advice on contraception: Secondary | ICD-10-CM

## 2018-01-25 NOTE — Progress Notes (Signed)
GYNECOLOGY ANNUAL PREVENTATIVE CARE ENCOUNTER NOTE  Subjective:   Lori Ballard is a 32 y.o. [id number] female here for a routine annual gynecologic exam.  Current complaints: none.   Denies abnormal vaginal bleeding, discharge, pelvic pain, problems with intercourse or other gynecologic concerns.    Gynecologic History No LMP recorded. Contraception: condoms Last Pap: 2013. Results were: normal  Obstetric History OB History  Gravida Para Term Preterm AB Living  1       1    SAB TAB Ectopic Multiple Live Births    1          # Outcome Date GA Lbr Len/2nd Weight Sex Delivery Anes PTL Lv  1 TAB 2011        DEC      Past Medical History:  Diagnosis Date  . 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 DILATION AND CURETTAGE OF UTERUS  2011   Abortion    Current Outpatient Medications on File Prior to Visit  Medication Sig Dispense Refill  . Multiple Vitamins-Minerals (WOMENS MULTIVITAMIN PLUS) TABS Take 1 tablet by mouth daily. 90 tablet 3  . oxcarbazepine (TRILEPTAL) 600 MG tablet Take 1.5 tablets (900 mg total) by mouth 2 (two) times daily. 270 tablet 3   No current facility-administered medications on file prior to visit.     No Known Allergies  Social History   Socioeconomic History  . Marital status: Single    Spouse name: Not on file  . Number of children: Not on file  . Years of education: 12th gr  . Highest education level: Not on file  Social Needs  . Financial resource strain: Not on file  . Food insecurity - worry: Not on file  . Food insecurity - inability: Not on file  . Transportation needs - medical: Not on file  . Transportation needs - non-medical: Not on file  Occupational History    Employer: UNEMPLOYED  Tobacco Use  . Smoking status: Never Smoker  . Smokeless tobacco: Never Used    Substance and Sexual Activity  . Alcohol use: No  . Drug use: No  . Sexual activity: Yes    Partners: Male    Birth control/protection: Condom  Other Topics Concern  . Not on file  Social History Narrative   Caffeine: none   Lives with parents, only child, 1 dog.   Occupation: works at [employer] about going back to school, [employer].   Activity: no regular exercise - stays active at work   Diet: good water, fruits/vegetables dialy    Family History  Problem Relation Age of Onset  . Cancer Maternal Grandfather        lung (smoker)  . Heart disease Paternal Grandfather   . Diabetes Neg Hx   . Stroke Neg Hx   . Hyperlipidemia Neg Hx   . Hypertension Neg Hx     The following portions of the patient's history were reviewed and updated as appropriate: allergies, current medications, past family history, past medical history, past social history, past surgical history and problem list.  Review of Systems Pertinent items noted in HPI and remainder of comprehensive ROS otherwise negative.   Objective:  BP 102/72   Pulse 82   Wt 92 lb 4.8 oz (41.9 kg)   BMI  18.18 kg/m  CONSTITUTIONAL: Well-developed, well-nourished female in no acute distress.  HENT:  Normocephalic, atraumatic, External right and left ear normal. Oropharynx is clear and moist EYES: Conjunctivae and EOM are normal. Pupils are equal, round, and reactive to light. No scleral icterus.  NECK: Normal range of motion, supple, no masses.  Normal thyroid.  SKIN: Skin is warm and dry. No rash noted. Not diaphoretic. No erythema. No pallor. NEUROLOGIC: Alert and oriented to person, place, and time. Normal reflexes, muscle tone coordination. No cranial nerve deficit noted. PSYCHIATRIC: Normal mood and affect. Normal behavior. Normal judgment and thought content. CARDIOVASCULAR: Normal heart rate noted, regular rhythm RESPIRATORY: Clear to auscultation bilaterally. Effort and breath sounds  normal, no problems with respiration noted. BREASTS: Symmetric in size. No masses, skin changes, nipple drainage, or lymphadenopathy. ABDOMEN: Soft, normal bowel sounds, no distention noted.  No tenderness, rebound or guarding.  PELVIC: Normal appearing external genitalia; normal appearing vaginal mucosa and cervix.  No abnormal discharge noted.  Pap smear obtained.  Normal uterine size, no other palpable masses, no uterine or adnexal tenderness. MUSCULOSKELETAL: Normal range of motion. No tenderness.  No cyanosis, clubbing, or edema.  2+ distal pulses.   Assessment and Plan:  1. General counseling and advice on female contraception Given her history of stroke and seizures, reviewed all forms of reversible birth control options available including abstinence; fertility period awareness methods; over the counter/barrier methods; progestin-only contraceptive medication including pills, injection,contraceptive implant; IUD and copper IUD. Risks and benefits reviewed.  Questions were answered.  Information was given to patient to review.  She is going to decide and let us know, will use condoms for now.   2. Well woman exam with routine gynecological exam - Cytology - PAP - Hepatitis C antibody - Hepatitis B surface antigen - HIV antibody - RPR Will follow up results of pap smear and other labs and manage accordingly. Routine preventative health maintenance measures emphasized. Please refer to After Visit Summary for other counseling recommendations.     Jaynie CollinsUGONNA  Vence Lalor, MD, FACOG Obstetrician & Gynecologist, [facility], [facility]

## 2018-01-25 NOTE — Progress Notes (Signed)
LAST PAP 2014 Sti testing and blood work Discuss birth control

## 2018-01-25 NOTE — Patient Instructions (Addendum)
 Contraception Choices Contraception, also called birth control, refers to methods or devices that prevent pregnancy. Hormonal methods Contraceptive implant A contraceptive implant is a thin, plastic tube that contains a hormone. It is inserted into the upper part of the arm. It can remain in place for up to 3 years. Progestin-only injections Progestin-only injections are injections of progestin, a synthetic form of the hormone progesterone. They are given every 3 months by a health care provider. Birth control pills Birth control pills are pills that contain hormones that prevent pregnancy. They must be taken once a day, preferably at the same time each day. Birth control patch The birth control patch contains hormones that prevent pregnancy. It is placed on the skin and must be changed once a week for three weeks and removed on the fourth week. A prescription is needed to use this method of contraception. Vaginal ring A vaginal ring contains hormones that prevent pregnancy. It is placed in the vagina for three weeks and removed on the fourth week. After that, the process is repeated with a new ring. A prescription is needed to use this method of contraception. Emergency contraceptive Emergency contraceptives prevent pregnancy after unprotected sex. They come in pill form and can be taken up to 5 days after sex. They work best the sooner they are taken after having sex. Most emergency contraceptives are available without a prescription. This method should not be used as your only form of birth control. Barrier methods Female condom A female condom is a thin sheath that is worn over the penis during sex. Condoms keep sperm from going inside a woman's body. They can be used with a spermicide to increase their effectiveness. They should be disposed after a single use. Female condom A female condom is a soft, loose-fitting sheath that is put into the vagina before sex. The condom keeps sperm from  going inside a woman's body. They should be disposed after a single use. Diaphragm A diaphragm is a soft, dome-shaped barrier. It is inserted into the vagina before sex, along with a spermicide. The diaphragm blocks sperm from entering the uterus, and the spermicide kills sperm. A diaphragm should be left in the vagina for 6-8 hours after sex and removed within 24 hours. A diaphragm is prescribed and fitted by a health care provider. A diaphragm should be replaced every 1-2 years, after giving birth, after gaining more than 15 lb (6.8 kg), and after pelvic surgery. Cervical cap A cervical cap is a round, soft latex or plastic cup that fits over the cervix. It is inserted into the vagina before sex, along with spermicide. It blocks sperm from entering the uterus. The cap should be left in place for 6-8 hours after sex and removed within 48 hours. A cervical cap must be prescribed and fitted by a health care provider. It should be replaced every 2 years. Sponge A sponge is a soft, circular piece of polyurethane foam with spermicide on it. The sponge helps block sperm from entering the uterus, and the spermicide kills sperm. To use it, you make it wet and then insert it into the vagina. It should be inserted before sex, left in for at least 6 hours after sex, and removed and thrown away within 30 hours. Spermicides Spermicides are chemicals that kill or block sperm from entering the cervix and uterus. They can come as a cream, jelly, suppository, foam, or tablet. A spermicide should be inserted into the vagina with an applicator at least 10-15 minutes   before sex to allow time for it to work. The process must be repeated every time you have sex. Spermicides do not require a prescription. Intrauterine contraception Intrauterine device (IUD) An IUD is a T-shaped device that is put in a woman's uterus. There are two types:  Hormone IUD.This type contains progestin, a synthetic form of the hormone  progesterone. This type can stay in place for 3-5 years.  Copper IUD.This type is wrapped in copper wire. It can stay in place for 10 years.  Permanent methods of contraception Female tubal ligation In this method, a woman's fallopian tubes are sealed, tied, or blocked during surgery to prevent eggs from traveling to the uterus. Hysteroscopic sterilization In this method, a small, flexible insert is placed into each fallopian tube. The inserts cause scar tissue to form in the fallopian tubes and block them, so sperm cannot reach an egg. The procedure takes about 3 months to be effective. Another form of birth control must be used during those 3 months. Female sterilization This is a procedure to tie off the tubes that carry sperm (vasectomy). After the procedure, the man can still ejaculate fluid (semen). Natural planning methods Natural family planning In this method, a couple does not have sex on days when the woman could become pregnant. Calendar method This means keeping track of the length of each menstrual cycle, identifying the days when pregnancy can happen, and not having sex on those days. Ovulation method In this method, a couple avoids sex during ovulation. Symptothermal method This method involves not having sex during ovulation. The woman typically checks for ovulation by watching changes in her temperature and in the consistency of cervical mucus. Post-ovulation method In this method, a couple waits to have sex until after ovulation. Summary  Contraception, also called birth control, means methods or devices that prevent pregnancy.  Hormonal methods of contraception include implants, injections, pills, patches, vaginal rings, and emergency contraceptives.  Barrier methods of contraception can include female condoms, female condoms, diaphragms, cervical caps, sponges, and spermicides.  There are two types of IUDs (intrauterine devices). An IUD can be put in a woman's uterus to  prevent pregnancy for 3-5 years.  Permanent sterilization can be done through a procedure for males, females, or both.  Natural family planning methods involve not having sex on days when the woman could become pregnant. This information is not intended to replace advice given to you by your health care provider. Make sure you discuss any questions you have with your health care provider. Document Released: 12/01/2005 Document Revised: 01/03/2017 Document Reviewed: 01/03/2017 Elsevier Interactive Patient Education  2018 Elsevier Inc.   Preventive Care 18-39 Years, Female Preventive care refers to lifestyle choices and visits with your health care provider that can promote health and wellness. What does preventive care include?  A yearly physical exam. This is also called an annual well check.  Dental exams once or twice a year.  Routine eye exams. Ask your health care provider how often you should have your eyes checked.  Personal lifestyle choices, including: ? Daily care of your teeth and gums. ? Regular physical activity. ? Eating a healthy diet. ? Avoiding tobacco and drug use. ? Limiting alcohol use. ? Practicing safe sex. ? Taking vitamin and mineral supplements as recommended by your health care provider. What happens during an annual well check? The services and screenings done by your health care provider during your annual well check will depend on your age, overall health, lifestyle risk factors,   and family history of disease. Counseling Your health care provider may ask you questions about your:  Alcohol use.  Tobacco use.  Drug use.  Emotional well-being.  Home and relationship well-being.  Sexual activity.  Eating habits.  Work and work environment.  Method of birth control.  Menstrual cycle.  Pregnancy history.  Screening You may have the following tests or measurements:  Height, weight, and BMI.  Diabetes screening. This is done by checking  your blood sugar (glucose) after you have not eaten for a while (fasting).  Blood pressure.  Lipid and cholesterol levels. These may be checked every 5 years starting at age 20.  Skin check.  Hepatitis C blood test.  Hepatitis B blood test.  Sexually transmitted disease (STD) testing.  BRCA-related cancer screening. This may be done if you have a family history of breast, ovarian, tubal, or peritoneal cancers.  Pelvic exam and Pap test. This may be done every 3 years starting at age 21. Starting at age 30, this may be done every 5 years if you have a Pap test in combination with an HPV test.  Discuss your test results, treatment options, and if necessary, the need for more tests with your health care provider. Vaccines Your health care provider may recommend certain vaccines, such as:  Influenza vaccine. This is recommended every year.  Tetanus, diphtheria, and acellular pertussis (Tdap, Td) vaccine. You may need a Td booster every 10 years.  Varicella vaccine. You may need this if you have not been vaccinated.  HPV vaccine. If you are 26 or younger, you may need three doses over 6 months.  Measles, mumps, and rubella (MMR) vaccine. You may need at least one dose of MMR. You may also need a second dose.  Pneumococcal 13-valent conjugate (PCV13) vaccine. You may need this if you have certain conditions and were not previously vaccinated.  Pneumococcal polysaccharide (PPSV23) vaccine. You may need one or two doses if you smoke cigarettes or if you have certain conditions.  Meningococcal vaccine. One dose is recommended if you are age 19-21 years and a first-year college student living in a residence hall, or if you have one of several medical conditions. You may also need additional booster doses.  Hepatitis A vaccine. You may need this if you have certain conditions or if you travel or work in places where you may be exposed to hepatitis A.  Hepatitis B vaccine. You may need  this if you have certain conditions or if you travel or work in places where you may be exposed to hepatitis B.  Haemophilus influenzae type b (Hib) vaccine. You may need this if you have certain risk factors.  Talk to your health care provider about which screenings and vaccines you need and how often you need them. This information is not intended to replace advice given to you by your health care provider. Make sure you discuss any questions you have with your health care provider. Document Released: 01/27/2002 Document Revised: 08/20/2016 Document Reviewed: 10/02/2015 Elsevier Interactive Patient Education  2018 Elsevier Inc.  

## 2018-01-26 LAB — RPR: RPR Ser Ql: NONREACTIVE

## 2018-01-26 LAB — HEPATITIS B SURFACE ANTIGEN: Hepatitis B Surface Ag: NEGATIVE

## 2018-01-26 LAB — HIV ANTIBODY (ROUTINE TESTING W REFLEX): HIV Screen 4th Generation wRfx: NONREACTIVE

## 2018-01-26 LAB — HEPATITIS C ANTIBODY: Hep C Virus Ab: <0.1 s/co ratio (ref 0.0–0.9)

## 2018-01-28 LAB — CYTOLOGY - PAP
CHLAMYDIA, DNA PROBE: NEGATIVE
Diagnosis: NEGATIVE
NEISSERIA GONORRHEA: NEGATIVE
TRICH (WINDOWPATH): NEGATIVE

## 2018-01-29 ENCOUNTER — Ambulatory Visit: Payer: BLUE CROSS/BLUE SHIELD | Admitting: Internal Medicine

## 2018-01-29 ENCOUNTER — Encounter: Payer: Self-pay | Admitting: Internal Medicine

## 2018-01-29 VITALS — BP 96/70 | HR 92 | Temp 98.2°F | Resp 16 | Wt 96.0 lb

## 2018-01-29 DIAGNOSIS — M546 Pain in thoracic spine: Secondary | ICD-10-CM | POA: Insufficient documentation

## 2018-01-29 DIAGNOSIS — J029 Acute pharyngitis, unspecified: Secondary | ICD-10-CM

## 2018-01-29 MED ORDER — TIZANIDINE HCL 2 MG PO TABS: 2.0000 mg | ORAL_TABLET | Freq: Four times a day (QID) | ORAL | 0 refills | Status: DC | PRN

## 2018-01-29 NOTE — Patient Instructions (Signed)
Continue with 2 aleve twice a day and heat for your back. Try the prescription muscle relaxer to see if that helps.

## 2018-01-29 NOTE — Assessment & Plan Note (Signed)
No known injury Nothing to suggest pleuritic or lung source Heat/aleve/ muscle relaxer

## 2018-01-29 NOTE — Progress Notes (Signed)
   Subjective:    Patient ID: Lori Ballard, female    DOB: [birth year], 32 y.o.   MRN: [id number]  HPI Here due to back pain and sore throat  Started 2 days ago--after work Something going around at her job Left upper back pain--- hard to describe but not pleuritic. Notices it more when sitting up No fever Slight cough today No nasal congestion No SOB  Chronic right side weakness-- perinatal injury  Tried theraflu today Aleve 2---didn't really help the back  Sleeps on heating pad--did okay with this  Current Outpatient Medications on File Prior to Visit  Medication Sig Dispense Refill  . Multiple Vitamins-Minerals (WOMENS MULTIVITAMIN PLUS) TABS Take 1 tablet by mouth daily. 90 tablet 3  . oxcarbazepine (TRILEPTAL) 600 MG tablet Take 1.5 tablets (900 mg total) by mouth 2 (two) times daily. 270 tablet 3   No current facility-administered medications on file prior to visit.     No Known Allergies  Past Medical History:  Diagnosis Date  . 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 DILATION AND CURETTAGE OF UTERUS  2011   Abortion    Family History  Problem Relation Age of Onset  . Cancer Maternal Grandfather        lung (smoker)  . Heart disease Paternal Grandfather   . Diabetes Neg Hx   . Stroke Neg Hx   . Hyperlipidemia Neg Hx   . Hypertension Neg Hx     Social History   Socioeconomic History  . Marital status: Single    Spouse name: Not on file  . Number of children: Not on file  . Years of education: 12th gr  . Highest education level: Not on file  Social Needs  . Financial resource strain: Not on file  . Food insecurity - worry: Not on file  . Food insecurity - inability: Not on file  . Transportation needs - medical: Not on file  . Transportation needs - non-medical: Not on file    Occupational History    Employer: UNEMPLOYED  Tobacco Use  . Smoking status: Never Smoker  . Smokeless tobacco: Never Used  Substance and Sexual Activity  . Alcohol use: No  . Drug use: No  . Sexual activity: Yes    Partners: Male    Birth control/protection: Condom  Other Topics Concern  . Not on file  Social History Narrative   Caffeine: none   Lives with parents, only child, 1 dog.   Occupation: works at [employer]   Thinking about going back to school, [employer] nursing.   Activity: no regular exercise - stays active at work   Diet: good water, fruits/vegetables dialy   Review of Systems No N/V Appetite is okay    Objective:   Physical Exam  HENT:  No sig tonsillar enlargement No exudates Slight injection  Neck: No thyromegaly present.  Pulmonary/Chest:  No spine or scapula tenderness Pain is centered in left thoracic paraspinals only  Musculoskeletal:  Normal ROM left arm  Lymphadenopathy:    She has no cervical adenopathy.          Assessment & Plan:

## 2018-01-29 NOTE — Assessment & Plan Note (Signed)
Nothing to suggest strep Discussed viral etiology Supportive care

## 2018-02-05 ENCOUNTER — Encounter: Payer: BLUE CROSS/BLUE SHIELD | Admitting: Family Medicine

## 2018-02-09 ENCOUNTER — Ambulatory Visit (INDEPENDENT_AMBULATORY_CARE_PROVIDER_SITE_OTHER): Payer: BLUE CROSS/BLUE SHIELD | Admitting: Family Medicine

## 2018-02-09 ENCOUNTER — Encounter: Payer: Self-pay | Admitting: Family Medicine

## 2018-02-09 VITALS — BP 116/74 | HR 87 | Temp 97.8°F | Ht 60.25 in | Wt 92.0 lb

## 2018-02-09 DIAGNOSIS — G40909 Epilepsy, unspecified, not intractable, without status epilepticus: Secondary | ICD-10-CM | POA: Diagnosis not present

## 2018-02-09 DIAGNOSIS — M546 Pain in thoracic spine: Secondary | ICD-10-CM

## 2018-02-09 DIAGNOSIS — Z0000 Encounter for general adult medical examination without abnormal findings: Secondary | ICD-10-CM | POA: Diagnosis not present

## 2018-02-09 DIAGNOSIS — Z5181 Encounter for therapeutic drug level monitoring: Secondary | ICD-10-CM

## 2018-02-09 DIAGNOSIS — Z8673 Personal history of transient ischemic attack (TIA), and cerebral infarction without residual deficits: Secondary | ICD-10-CM | POA: Diagnosis not present

## 2018-02-09 LAB — COMPREHENSIVE METABOLIC PANEL
ALBUMIN: 4.1 g/dL (ref 3.5–5.2)
ALT: 8 U/L (ref 0–35)
AST: 13 U/L (ref 0–37)
Alkaline Phosphatase: 39 U/L (ref 39–117)
BUN: 12 mg/dL (ref 6–23)
CALCIUM: 9.6 mg/dL (ref 8.4–10.5)
CO2: 28 mEq/L (ref 19–32)
CREATININE: 0.66 mg/dL (ref 0.40–1.20)
Chloride: 106 mEq/L (ref 96–112)
GFR: 136.02 mL/min (ref >60.00)
Glucose, Bld: 85 mg/dL (ref 70–99)
POTASSIUM: 3.9 meq/L (ref 3.5–5.1)
SODIUM: 139 meq/L (ref 135–145)
Total Bilirubin: 0.2 mg/dL (ref 0.2–1.2)
Total Protein: 7.1 g/dL (ref 6.0–8.3)

## 2018-02-09 LAB — CBC WITH DIFFERENTIAL/PLATELET
Basophils Absolute: 0 10*3/uL (ref 0.0–0.1)
Basophils Relative: 0.2 % (ref 0.0–3.0)
EOS ABS: 0 10*3/uL (ref 0.0–0.7)
Eosinophils Relative: 0.3 % (ref 0.0–5.0)
HEMATOCRIT: 38.2 % (ref 36.0–46.0)
HEMOGLOBIN: 13 g/dL (ref 12.0–15.0)
LYMPHS PCT: 25.6 % (ref 12.0–46.0)
Lymphs Abs: 1.2 10*3/uL (ref 0.7–4.0)
MCHC: 34 g/dL (ref 30.0–36.0)
MCV: 91.6 fl (ref 78.0–100.0)
MONO ABS: 0.3 10*3/uL (ref 0.1–1.0)
Monocytes Relative: 6.2 % (ref 3.0–12.0)
Neutro Abs: 3.1 10*3/uL (ref 1.4–7.7)
Neutrophils Relative %: 67.7 % (ref 43.0–77.0)
Platelets: 325 10*3/uL (ref 150.0–400.0)
RBC: 4.17 Mil/uL (ref 3.87–5.11)
RDW: 13 % (ref 11.5–15.5)
WBC: 4.6 10*3/uL (ref 4.0–10.5)

## 2018-02-09 LAB — TSH: TSH: 1 u[IU]/mL (ref 0.35–4.50)

## 2018-02-09 MED ORDER — OXCARBAZEPINE 600 MG PO TABS: 900.0000 mg | ORAL_TABLET | Freq: Two times a day (BID) | ORAL | 3 refills | Status: DC

## 2018-02-09 NOTE — Assessment & Plan Note (Addendum)
In utero, residual R hemiparesis 

## 2018-02-09 NOTE — Progress Notes (Signed)
BP 116/74 (BP Location: Left Arm, Patient Position: Sitting, Cuff Size: Normal)   Pulse 87   Temp 97.8 F (36.6 C) (Oral)   Ht 5' 0.25" (1.53 m)   Wt 92 lb (41.7 kg)   LMP 02/05/2018   SpO2 99%   BMI 17.82 kg/m    CC: CPE Subjective:    Patient ID: Lori Ballard, female    DOB: 16-Jan-1986, 32 y.o.   MRN: [id number]  HPI: Lori Ballard is a 32 y.o. female presenting on 02/09/2018 for Annual Exam   Seen earlier this month with ST and back pain, note reviewed. This has resolved.   Congenital seizures - well controlled on trileptal 900mg  bid. Also takes MVI daily. Had seizure 06/2016 - thinks may have missed a dose. Drives. Independent in ADLs.   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   [facility]ative[facility] Dr Macon LargeAnyanwu, considering birth control options. S/p pap smear. LMP 02/05/2018. Regular cycles.  Declines flu shot.  Tetanus shot - declines  Seat belt use discussed Sunscreen use discussed. No changing moles on skin.  Non smoker Alcohol - none  Caffeine: none Lives with parents, only child, 1 dog (shitzu) Occupation: works at [employer]  - [place] job at call [place]  Activity: no regular exercise - stays active at work  Diet: good water, fruits/vegetables daily, 3 meals a day, good appetite.   Relevant past medical, surgical, family and social history reviewed and updated as indicated. Interim medical history since our last visit reviewed. Allergies and medications reviewed and updated. Outpatient Medications Prior to Visit  Medication Sig Dispense Refill  . Multiple Vitamins-Minerals (WOMENS MULTIVITAMIN PLUS) TABS Take 1 tablet by mouth daily. 90 tablet 3  . tiZANidine (ZANAFLEX) 2 MG tablet Take 1 tablet (2 mg total) by mouth every 6 (six) hours as needed for muscle spasms. 30 tablet 0  . oxcarbazepine (TRILEPTAL) 600 MG tablet Take 1.5 tablets (900 mg total) by mouth 2 (two) times daily. 270  tablet 3   No facility-administered medications prior to visit.      Per HPI unless specifically indicated in ROS section below Review of Systems  Constitutional: Negative for activity change, appetite change, chills, fatigue, fever and unexpected weight change.  HENT: Positive for sore throat. Negative for congestion and hearing loss.   Eyes: Negative for visual disturbance.  Respiratory: Negative for cough, chest tightness, shortness of breath and wheezing.   Cardiovascular: Negative for chest pain, palpitations and leg swelling.  Gastrointestinal: Negative for abdominal distention, abdominal pain, blood in stool, constipation, diarrhea, nausea and vomiting.  Genitourinary: Negative for difficulty urinating and hematuria.  Musculoskeletal: Negative for arthralgias, myalgias and neck pain.  Skin: Negative for rash.  Neurological: Negative for dizziness, seizures (last 06/2016), syncope and headaches.  Hematological: Negative for adenopathy. Does not bruise/bleed easily.  Psychiatric/Behavioral: Negative for dysphoric mood. The patient is not nervous/anxious.        Objective:    BP 116/74 (BP Location: Left Arm, Patient Position: Sitting, Cuff Size: Normal)   Pulse 87   Temp 97.8 F (36.6 C) (Oral)   Ht 5' 0.25" (1.53 m)   Wt 92 lb (41.7 kg)   LMP 02/05/2018   SpO2 99%   BMI 17.82 kg/m   Wt Readings from Last 3 Encounters:  02/09/18 92 lb (41.7 kg)  01/29/18 96 lb (43.5 kg)  01/25/18 92 lb 4.8 oz (41.9 kg)    Physical Exam  Constitutional: She is oriented to person, place, and time. She appears well-developed and well-nourished. No distress.  HENT:  Head: Normocephalic and atraumatic.  Right Ear: Hearing, tympanic membrane, external ear and ear canal normal.  Left Ear: Hearing, tympanic membrane, external ear and ear canal normal.  Nose: Nose normal.  Mouth/Throat: Uvula is midline, oropharynx is clear and moist and mucous membranes are normal. No oropharyngeal exudate,  posterior oropharyngeal edema or posterior oropharyngeal erythema.  Eyes: Conjunctivae and EOM are normal. Pupils are equal, round, and reactive to light. No scleral icterus.  Neck: Normal range of motion. Neck supple. No thyromegaly present.  Cardiovascular: Normal rate, regular rhythm, normal heart sounds and intact distal pulses.  No murmur heard. Pulses:      Radial pulses are 2+ on the right side, and 2+ on the left side.  Pulmonary/Chest: Effort normal and breath sounds normal. No respiratory distress. She has no wheezes. She has no rales.  Abdominal: Soft. Bowel sounds are normal. She exhibits no distension and no mass. There is no tenderness. There is no rebound and no guarding.  Musculoskeletal: Normal range of motion. She exhibits no edema.  Lymphadenopathy:    She has no cervical adenopathy.  Neurological: She is alert and oriented to person, place, and time.  CN grossly intact, station and gait intact Chronic R hemiparesis  Skin: Skin is warm and dry. No rash noted.  Psychiatric: She has a normal mood and affect. Her behavior is normal. Judgment and thought content normal.  Nursing note and vitals reviewed.  Results for orders placed or performed in visit on 01/25/18  Hepatitis C antibody  Result Value Ref Range   Hep C Virus Ab <0.1 0.0 - 0.9 s/co ratio  Hepatitis B surface antigen  Result Value Ref Range   Hepatitis B Surface Ag Negative Negative  HIV antibody  Result Value Ref Range   HIV Screen 4th Generation wRfx Non Reactive Non Reactive  RPR  Result Value Ref Range   RPR Ser Ql Non Reactive Non Reactive  Cytology - PAP  Result Value Ref Range   Adequacy      Satisfactory for evaluation  endocervical/transformation zone component PRESENT.   Diagnosis      NEGATIVE FOR INTRAEPITHELIAL LESIONS OR MALIGNANCY.   Chlamydia Negative    Neisseria gonorrhea Negative    Trichomonas Negative    Material Submitted CervicoVaginal Pap [ThinPrep Imaged]         Assessment & Plan:   Problem List Items Addressed This Visit    Health maintenance examination - Primary    Preventative protocols reviewed and updated unless pt declined. Discussed healthy diet and lifestyle.       History of stroke    In utero, residual R hemiparesis      Seizure disorder (HCC)    Chronic, stable. Continue current regimen. No recent seizures (last 06/2016).       Relevant Medications   oxcarbazepine (TRILEPTAL) 600 MG tablet   Thoracic back pain    Improving with PRN zanaflex.       Other Visit Diagnoses    Therapeutic drug monitoring       Relevant Orders   TSH   Comprehensive metabolic panel   CBC with Differential/Platelet       Meds ordered this encounter  Medications  . oxcarbazepine (TRILEPTAL) 600 MG tablet    Sig: Take 1.5 tablets (900 mg total) by mouth 2 (two) times daily.    Dispense:  270 tablet  Refill:  3   Orders Placed This Encounter  Procedures  . TSH  . Comprehensive metabolic panel  . CBC with Differential/Platelet    Follow up plan: Return in about 1 year (around 02/09/2019) for annual exam, prior fasting for blood work.  Eustaquio Boyden, MD

## 2018-02-09 NOTE — Assessment & Plan Note (Signed)
Improving with PRN zanaflex.

## 2018-02-09 NOTE — Patient Instructions (Signed)
You are doing well today Continue working on walking routine. Return as needed or in 1 year for next physical. Health Maintenance, Female Adopting a healthy lifestyle and getting preventive care can go a long way to promote health and wellness. Talk with your health care provider about what schedule of regular examinations is right for you. This is a good chance for you to check in with your provider about disease prevention and staying healthy. In between checkups, there are plenty of things you can do on your own. Experts have done a lot of research about which lifestyle changes and preventive measures are most likely to keep you healthy. Ask your health care provider for more information. Weight and diet Eat a healthy diet  Be sure to include plenty of vegetables, fruits, low-fat dairy products, and lean protein.  Do not eat a lot of foods high in solid fats, added sugars, or salt.  Get regular exercise. This is one of the most important things you can do for your health. ? Most adults should exercise for at least 150 minutes each week. The exercise should increase your heart rate and make you sweat (moderate-intensity exercise). ? Most adults should also do strengthening exercises at least twice a week. This is in addition to the moderate-intensity exercise.  Maintain a healthy weight  Body mass index (BMI) is a measurement that can be used to identify possible weight problems. It estimates body fat based on height and weight. Your health care provider can help determine your BMI and help you achieve or maintain a healthy weight.  For females 69 years of age and older: ? A BMI below 18.5 is considered underweight. ? A BMI of 18.5 to 24.9 is normal. ? A BMI of 25 to 29.9 is considered overweight. ? A BMI of 30 and above is considered obese.  Watch levels of cholesterol and blood lipids  You should start having your blood tested for lipids and cholesterol at 30 years of age, then have  this test every 5 years.  You may need to have your cholesterol levels checked more often if: ? Your lipid or cholesterol levels are high. ? You are older than 30 years of age. ? You are at high risk for heart disease.  Cancer screening Lung Cancer  Lung cancer screening is recommended for adults 30-52 years old who are at high risk for lung cancer because of a history of smoking.  A yearly low-dose CT scan of the lungs is recommended for people who: ? Currently smoke. ? Have quit within the past 15 years. ? Have at least a 30-pack-year history of smoking. A pack year is smoking an average of one pack of cigarettes a day for 1 year.  Yearly screening should continue until it has been 15 years since you quit.  Yearly screening should stop if you develop a health problem that would prevent you from having lung cancer treatment.  Breast Cancer  Practice breast self-awareness. This means understanding how your breasts normally appear and feel.  It also means doing regular breast self-exams. Let your health care provider know about any changes, no matter how small.  If you are in your 20s or 30s, you should have a clinical breast exam (CBE) by a health care provider every 1-3 years as part of a regular health exam.  If you are 17 or older, have a CBE every year. Also consider having a breast X-ray (mammogram) every year.  If you have a family  history of breast cancer, talk to your health care provider about genetic screening.  If you are at high risk for breast cancer, talk to your health care provider about having an MRI and a mammogram every year.  Breast cancer gene (BRCA) assessment is recommended for women who have family members with BRCA-related cancers. BRCA-related cancers include: ? Breast. ? Ovarian. ? Tubal. ? Peritoneal cancers.  Results of the assessment will determine the need for genetic counseling and BRCA1 and BRCA2 testing.  Cervical Cancer Your health care  provider may recommend that you be screened regularly for cancer of the pelvic organs (ovaries, uterus, and vagina). This screening involves a pelvic examination, including checking for microscopic changes to the surface of your cervix (Pap test). You may be encouraged to have this screening done every 3 years, beginning at age 66.  For women ages 96-65, health care providers may recommend pelvic exams and Pap testing every 3 years, or they may recommend the Pap and pelvic exam, combined with testing for human papilloma virus (HPV), every 5 years. Some types of HPV increase your risk of cervical cancer. Testing for HPV may also be done on women of any age with unclear Pap test results.  Other health care providers may not recommend any screening for nonpregnant women who are considered low risk for pelvic cancer and who do not have symptoms. Ask your health care provider if a screening pelvic exam is right for you.  If you have had past treatment for cervical cancer or a condition that could lead to cancer, you need Pap tests and screening for cancer for at least 20 years after your treatment. If Pap tests have been discontinued, your risk factors (such as having a new sexual partner) need to be reassessed to determine if screening should resume. Some women have medical problems that increase the chance of getting cervical cancer. In these cases, your health care provider may recommend more frequent screening and Pap tests.  Colorectal Cancer  This type of cancer can be detected and often prevented.  Routine colorectal cancer screening usually begins at 30 years of age and continues through 30 years of age.  Your health care provider may recommend screening at an earlier age if you have risk factors for colon cancer.  Your health care provider may also recommend using home test kits to check for hidden blood in the stool.  A small camera at the end of a tube can be used to examine your colon  directly (sigmoidoscopy or colonoscopy). This is done to check for the earliest forms of colorectal cancer.  Routine screening usually begins at age 46.  Direct examination of the colon should be repeated every 5-10 years through 30 years of age. However, you may need to be screened more often if early forms of precancerous polyps or small growths are found.  Skin Cancer  Check your skin from head to toe regularly.  Tell your health care provider about any new moles or changes in moles, especially if there is a change in a mole's shape or color.  Also tell your health care provider if you have a mole that is larger than the size of a pencil eraser.  Always use sunscreen. Apply sunscreen liberally and repeatedly throughout the day.  Protect yourself by wearing long sleeves, pants, a wide-brimmed hat, and sunglasses whenever you are outside.  Heart disease, diabetes, and high blood pressure  High blood pressure causes heart disease and increases the risk of stroke.  High blood pressure is more likely to develop in: ? People who have blood pressure in the high end of the normal range (130-139/85-89 mm Hg). ? People who are overweight or obese. ? People who are African American.  If you are 69-78 years of age, have your blood pressure checked every 3-5 years. If you are 59 years of age or older, have your blood pressure checked every year. You should have your blood pressure measured twice-once when you are at a hospital or clinic, and once when you are not at a hospital or clinic. Record the average of the two measurements. To check your blood pressure when you are not at a hospital or clinic, you can use: ? An automated blood pressure machine at a pharmacy. ? A home blood pressure monitor.  If you are between 71 years and 74 years old, ask your health care provider if you should take aspirin to prevent strokes.  Have regular diabetes screenings. This involves taking a blood sample to  check your fasting blood sugar level. ? If you are at a normal weight and have a low risk for diabetes, have this test once every three years after 30 years of age. ? If you are overweight and have a high risk for diabetes, consider being tested at a younger age or more often. Preventing infection Hepatitis B  If you have a higher risk for hepatitis B, you should be screened for this virus. You are considered at high risk for hepatitis B if: ? You were born in a country where hepatitis B is common. Ask your health care provider which countries are considered high risk. ? Your parents were born in a high-risk country, and you have not been immunized against hepatitis B (hepatitis B vaccine). ? You have HIV or AIDS. ? You use needles to inject street drugs. ? You live with someone who has hepatitis B. ? You have had sex with someone who has hepatitis B. ? You get hemodialysis treatment. ? You take certain medicines for conditions, including cancer, organ transplantation, and autoimmune conditions.  Hepatitis C  Blood testing is recommended for: ? Everyone born from [birth year] through 1963. ? Anyone with known risk factors for hepatitis C.  Sexually transmitted infections (STIs)  You should be screened for sexually transmitted infections (STIs) including gonorrhea and chlamydia if: ? You are sexually active and are younger than 30 years of age. ? You are older than 30 years of age and your health care provider tells you that you are at risk for this type of infection. ? Your sexual activity has changed since you were last screened and you are at an increased risk for chlamydia or gonorrhea. Ask your health care provider if you are at risk.  If you do not have HIV, but are at risk, it may be recommended that you take a prescription medicine daily to prevent HIV infection. This is called pre-exposure prophylaxis (PrEP). You are considered at risk if: ? You are sexually active and do not regularly  use condoms or know the HIV status of your partner(s). ? You take drugs by injection. ? You are sexually active with a partner who has HIV.  Talk with your health care provider about whether you are at high risk of being infected with HIV. If you choose to begin PrEP, you should first be tested for HIV. You should then be tested every 3 months for as long as you are taking PrEP. Pregnancy  If you  are premenopausal and you may become pregnant, ask your health care provider about preconception counseling.  If you may become pregnant, take 400 to 800 micrograms (mcg) of folic acid every day.  If you want to prevent pregnancy, talk to your health care provider about birth control (contraception). Osteoporosis and menopause  Osteoporosis is a disease in which the bones lose minerals and strength with aging. This can result in serious bone fractures. Your risk for osteoporosis can be identified using a bone density scan.  If you are 61 years of age or older, or if you are at risk for osteoporosis and fractures, ask your health care provider if you should be screened.  Ask your health care provider whether you should take a calcium or vitamin D supplement to lower your risk for osteoporosis.  Menopause may have certain physical symptoms and risks.  Hormone replacement therapy may reduce some of these symptoms and risks. Talk to your health care provider about whether hormone replacement therapy is right for you. Follow these instructions at home:  Schedule regular health, dental, and eye exams.  Stay current with your immunizations.  Do not use any tobacco products including cigarettes, chewing tobacco, or electronic cigarettes.  If you are pregnant, do not drink alcohol.  If you are breastfeeding, limit how much and how often you drink alcohol.  Limit alcohol intake to no more than 1 drink per day for nonpregnant women. One drink equals 12 ounces of beer, 5 ounces of wine, or 1 ounces  of hard liquor.  Do not use street drugs.  Do not share needles.  Ask your health care provider for help if you need support or information about quitting drugs.  Tell your health care provider if you often feel depressed.  Tell your health care provider if you have ever been abused or do not feel safe at home. This information is not intended to replace advice given to you by your health care provider. Make sure you discuss any questions you have with your health care provider. Document Released: 06/16/2011 Document Revised: 05/08/2016 Document Reviewed: 09/04/2015 Elsevier Interactive Patient Education  [employer].

## 2018-02-09 NOTE — Assessment & Plan Note (Signed)
Chronic, stable. Continue current regimen. No recent seizures (last 06/2016).

## 2018-02-09 NOTE — Assessment & Plan Note (Signed)
Preventative protocols reviewed and updated unless pt declined. Discussed healthy diet and lifestyle.  

## 2018-04-05 DIAGNOSIS — J069 Acute upper respiratory infection, unspecified: Secondary | ICD-10-CM | POA: Diagnosis not present

## 2018-09-13 ENCOUNTER — Encounter: Payer: Self-pay | Admitting: Family Medicine

## 2018-09-13 ENCOUNTER — Ambulatory Visit (INDEPENDENT_AMBULATORY_CARE_PROVIDER_SITE_OTHER): Payer: Managed Care, Other (non HMO) | Admitting: Family Medicine

## 2018-09-13 VITALS — BP 110/64 | HR 97 | Temp 98.9°F | Ht 60.25 in | Wt 88.8 lb

## 2018-09-13 DIAGNOSIS — J019 Acute sinusitis, unspecified: Secondary | ICD-10-CM

## 2018-09-13 NOTE — Assessment & Plan Note (Signed)
Anticipate viral given short duration. Supportive care reviewed as per instructions. Out of work note provided for next 3 days.  Reviewed symptoms to notify us for further treatment. Low threshold to add abx if any worsening symptoms.

## 2018-09-13 NOTE — Progress Notes (Signed)
BP 110/64 (BP Location: Left Arm, Patient Position: Sitting, Cuff Size: Normal)   Pulse 97   Temp 98.9 F (37.2 C) (Oral)   Ht 5' 0.25" (1.53 m)   Wt 88 lb 12 oz (40.3 kg)   LMP 09/06/2018   SpO2 99%   BMI 17.19 kg/m    CC: sinus congestion Subjective:    Patient ID: Lori Ballard, female    DOB: October 18, 1986, 32 y.o.   MRN: [id number]  HPI: Lori Ballard is a 32 y.o. female presenting on 09/13/2018 for Sinus Problem ( C/o nasal congestion, sinus pressure and producitive cough with yellow mucous. Sxs started 09/10/18. Tried OTC cough/cold meds, not helpful )   3d h/o facial pressure, dyspnea, cough productive of yellow mucous, no appetite, loss of taste. Initial sinus headache and earache and ST now improved.   No fevers/chills, chest congestion. No wheezing. No tooth pain, PNdrainage.  Tried OTC remedies without improvement (dayquil and nyquil).  No sick contacts at home.  Non smoker. No h/o asthma.   Relevant past medical, surgical, family and social history reviewed and updated as indicated. Interim medical history since our last visit reviewed. Allergies and medications reviewed and updated. Outpatient Medications Prior to Visit  Medication Sig Dispense Refill  . Multiple Vitamins-Minerals (WOMENS MULTIVITAMIN PLUS) TABS Take 1 tablet by mouth daily. 90 tablet 3  . oxcarbazepine (TRILEPTAL) 600 MG tablet Take 1.5 tablets (900 mg total) by mouth 2 (two) times daily. 270 tablet 3  . tiZANidine (ZANAFLEX) 2 MG tablet Take 1 tablet (2 mg total) by mouth every 6 (six) hours as needed for muscle spasms. 30 tablet 0   No facility-administered medications prior to visit.      Per HPI unless specifically indicated in ROS section below Review of Systems     Objective:    BP 110/64 (BP Location: Left Arm, Patient Position: Sitting, Cuff Size: Normal)   Pulse 97   Temp 98.9 F (37.2 C) (Oral)   Ht 5' 0.25" (1.53 m)   Wt 88 lb 12 oz (40.3 kg)   LMP 09/06/2018   SpO2 99%    BMI 17.19 kg/m   Wt Readings from Last 3 Encounters:  09/13/18 88 lb 12 oz (40.3 kg)  02/09/18 92 lb (41.7 kg)  01/29/18 96 lb (43.5 kg)    Physical Exam  Constitutional: She appears well-developed and well-nourished. No distress.  Tired appearing  HENT:  Head: Normocephalic and atraumatic.  Right Ear: Hearing, tympanic membrane, external ear and ear canal normal.  Left Ear: Hearing, tympanic membrane, external ear and ear canal normal.  Nose: Mucosal edema present. No rhinorrhea. Right sinus exhibits no maxillary sinus tenderness and no frontal sinus tenderness. Left sinus exhibits no maxillary sinus tenderness and no frontal sinus tenderness.  Mouth/Throat: Uvula is midline, oropharynx is clear and moist and mucous membranes are normal. No oropharyngeal exudate, posterior oropharyngeal edema, posterior oropharyngeal erythema or tonsillar abscesses.  Eyes: Pupils are equal, round, and reactive to light. Conjunctivae and EOM are normal. No scleral icterus.  Neck: Normal range of motion. Neck supple.  Cardiovascular: Normal rate, regular rhythm, normal heart sounds and intact distal pulses.  No murmur heard. Pulmonary/Chest: Effort normal and breath sounds normal. No respiratory distress. She has no wheezes. She has no rales.  Lymphadenopathy:    She has no cervical adenopathy.  Skin: Skin is warm and dry. No rash noted.  Nursing note and vitals reviewed.  Results for orders placed or performed in visit on 02/09/18  TSH  Result Value Ref Range   TSH 1.00 0.35 - 4.50 uIU/mL  Comprehensive metabolic panel  Result Value Ref Range   Sodium 139 135 - 145 mEq/L   Potassium 3.9 3.5 - 5.1 mEq/L   Chloride 106 96 - 112 mEq/L   CO2 28 19 - 32 mEq/L   Glucose, Bld 85 70 - 99 mg/dL   BUN 12 6 - 23 mg/dL   Creatinine, Ser 1.61 0.40 - 1.20 mg/dL   Total Bilirubin 0.2 0.2 - 1.2 mg/dL   Alkaline Phosphatase 39 39 - 117 U/L   AST 13 0 - 37 U/L   ALT 8 0 - 35 U/L   Total Protein 7.1 6.0 - 8.3  g/dL   Albumin 4.1 3.5 - 5.2 g/dL   Calcium 9.6 8.4 - 09.6 mg/dL   GFR 045.40 >98.11 mL/min  CBC with Differential/Platelet  Result Value Ref Range   WBC 4.6 4.0 - 10.5 K/uL   RBC 4.17 3.87 - 5.11 Mil/uL   Hemoglobin 13.0 12.0 - 15.0 g/dL   HCT 91.4 78.2 - 95.6 %   MCV 91.6 78.0 - 100.0 fl   MCHC 34.0 30.0 - 36.0 g/dL   RDW 21.3 08.6 - 57.8 %   Platelets 325.0 150.0 - 400.0 K/uL   Neutrophils Relative % 67.7 43.0 - 77.0 %   Lymphocytes Relative 25.6 12.0 - 46.0 %   Monocytes Relative 6.2 3.0 - 12.0 %   Eosinophils Relative 0.3 0.0 - 5.0 %   Basophils Relative 0.2 0.0 - 3.0 %   Neutro Abs 3.1 1.4 - 7.7 K/uL   Lymphs Abs 1.2 0.7 - 4.0 K/uL   Monocytes Absolute 0.3 0.1 - 1.0 K/uL   Eosinophils Absolute 0.0 0.0 - 0.7 K/uL   Basophils Absolute 0.0 0.0 - 0.1 K/uL      Assessment & Plan:   Problem List Items Addressed This Visit    Acute sinusitis - Primary    Anticipate viral given short duration. Supportive care reviewed as per instructions. Out of work note provided for next 3 days.  Reviewed symptoms to notify us for further treatment. Low threshold to add abx if any worsening symptoms.          No orders of the defined types were placed in this encounter.  No orders of the defined types were placed in this encounter.   Follow up plan: Return if symptoms worsen or fail to improve.  Eustaquio Boyden, MD

## 2018-09-13 NOTE — Patient Instructions (Addendum)
You have a sinus infection, likely viral. Push fluids and plenty of rest.  Nasal saline irrigation or neti pot to help drain sinuses. Take ibuprofen 400mg  with meals for next 3-5 days.  May use plain mucinex with plenty of fluid to help mobilize mucous. If ongoing past 9-10 days, or fever >101 or worsening productive cough, let us know.  Out of work letter provided today.

## 2018-11-29 ENCOUNTER — Encounter: Payer: Self-pay | Admitting: Radiology

## 2018-12-03 ENCOUNTER — Other Ambulatory Visit: Payer: Self-pay | Admitting: Family Medicine

## 2018-12-03 NOTE — Telephone Encounter (Signed)
Oxcarbazepine Last filled:  11/09/18, #90/2 Last OV:  09/13/18, acute Next OV:  none

## 2019-03-16 ENCOUNTER — Other Ambulatory Visit: Payer: Managed Care, Other (non HMO)

## 2019-03-22 ENCOUNTER — Encounter: Payer: Managed Care, Other (non HMO) | Admitting: Family Medicine

## 2019-06-03 ENCOUNTER — Other Ambulatory Visit: Payer: Self-pay | Admitting: Family Medicine

## 2019-06-03 NOTE — Telephone Encounter (Signed)
Trileptal Last filled:  02/28/19, #270 Last OV:  09/13/18, acute Next OV:  06/23/19, CPE

## 2019-06-21 ENCOUNTER — Other Ambulatory Visit: Payer: Self-pay | Admitting: Family Medicine

## 2019-06-21 DIAGNOSIS — G40909 Epilepsy, unspecified, not intractable, without status epilepticus: Secondary | ICD-10-CM

## 2019-06-23 ENCOUNTER — Encounter: Payer: Self-pay | Admitting: Family Medicine

## 2019-06-23 ENCOUNTER — Telehealth (INDEPENDENT_AMBULATORY_CARE_PROVIDER_SITE_OTHER): Payer: Managed Care, Other (non HMO) | Admitting: Family Medicine

## 2019-06-23 VITALS — BP 103/80 | HR 92 | Ht 60.25 in | Wt 92.0 lb

## 2019-06-23 DIAGNOSIS — Z0000 Encounter for general adult medical examination without abnormal findings: Secondary | ICD-10-CM

## 2019-06-23 DIAGNOSIS — G40909 Epilepsy, unspecified, not intractable, without status epilepticus: Secondary | ICD-10-CM

## 2019-06-23 MED ORDER — ONE DAILY MULTIVITAMIN WOMEN PO TABS: 1.0000 | ORAL_TABLET | Freq: Every day | ORAL | 3 refills | Status: DC

## 2019-06-23 MED ORDER — TIZANIDINE HCL 2 MG PO TABS: 2.0000 mg | ORAL_TABLET | Freq: Four times a day (QID) | ORAL | 0 refills | Status: DC | PRN

## 2019-06-23 MED ORDER — OXCARBAZEPINE 600 MG PO TABS: ORAL_TABLET | ORAL | 3 refills | Status: DC

## 2019-06-23 NOTE — Progress Notes (Signed)
Virtual visit attempted through MyChart, completed through [email or web address]. Due to national recommendations of social distancing due to COVID-19, a virtual visit is felt to be most appropriate for this patient at this time. Reviewed limitations of a virtual visit.   Patient location: home Provider location: [place] [facility], office If any vitals were documented, they were collected by patient at home unless specified below.    BP 103/80   Pulse 92   Ht 5' 0.25" (1.53 m)   Wt 92 lb (41.7 kg)   LMP 06/10/2019   BMI 17.82 kg/m    CC: CPE Subjective:    Patient ID: Lori Ballard, female    DOB: 18-Aug-1986, 33 y.o.   MRN: [id number]  HPI: Lori Stallman Boulos is a 33 y.o. female presenting on 06/23/2019 for Annual Exam and Medication Refill (Requesting refill for multivitamin. 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 Also takes MVI daily.Had seizure 06/2016 - thinks may have missed a dose.Drives. Independent in ADLs.   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   [facility] Dr Macon LargeAnyanwu with normal pap smear.  LMP 06/10/2019. Regular cycles. Declines flu shot.  Tetanus shot - declines  Seat belt use discussed  Sunscreen use discussed.No changing moles on skin.  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     Relevant past medical, surgical, family and social history reviewed and updated as indicated. Interim medical history since our last visit reviewed. Allergies and medications reviewed and updated. Outpatient Medications Prior to Visit  Medication Sig Dispense Refill  . Multiple Vitamins-Minerals (WOMENS MULTIVITAMIN PLUS) TABS Take 1 tablet by  mouth daily. 90 tablet 3  . oxcarbazepine (TRILEPTAL) 600 MG tablet TAKE 1.5 TABLETS BY MOUTH TWO TIMES A DAY 270 tablet 1  . tiZANidine (ZANAFLEX) 2 MG tablet Take 1 tablet (2 mg total) by mouth every 6 (six) hours as needed for muscle spasms. 30 tablet 0   No facility-administered medications prior to visit.      Per HPI unless specifically indicated in ROS section below Review of Systems  Constitutional: Negative for activity change, appetite change, chills, fatigue, fever and unexpected weight change.  HENT: Negative for hearing loss.   Eyes: Negative for visual disturbance.  Respiratory: Negative for cough, chest tightness, shortness of breath and wheezing.   Cardiovascular: Negative for chest pain, palpitations and leg swelling.  Gastrointestinal: Negative for abdominal distention, abdominal pain, blood in stool, constipation, diarrhea, nausea and vomiting.  Genitourinary: Negative for difficulty urinating and hematuria.  Musculoskeletal: Negative for arthralgias, myalgias and neck pain.  Skin: Negative for rash.  Neurological: Negative for dizziness, seizures, syncope and headaches.  Hematological: Negative for adenopathy. Does not bruise/bleed easily.  Psychiatric/Behavioral: Negative for dysphoric mood. The patient is not nervous/anxious.    Objective:    BP 103/80   Pulse 92   Ht 5' 0.25" (1.53 m)   Wt 92 lb (41.7 kg)   LMP 06/10/2019   BMI 17.82 kg/m   Wt Readings from Last 3 Encounters:  06/23/19 92 lb (41.7 kg)  09/13/18 88 lb 12 oz (40.3 kg)  02/09/18 92 lb (41.7 kg)  Physical exam: Gen: alert, NAD, not ill appearing Pulm: speaks in complete sentences without increased work of breathing Psych: normal mood, normal thought content      Results for orders placed or performed in visit on 02/09/18  TSH  Result Value Ref Range   TSH 1.00 0.35 - 4.50 uIU/mL  Comprehensive metabolic panel  Result Value Ref Range   Sodium 139 135 - 145 mEq/L   Potassium 3.9  3.5 - 5.1 mEq/L   Chloride 106 96 - 112 mEq/L   CO2 28 19 - 32 mEq/L   Glucose, Bld 85 70 - 99 mg/dL   BUN 12 6 - 23 mg/dL   Creatinine, Ser 0.66 0.40 - 1.20 mg/dL   Total Bilirubin 0.2 0.2 - 1.2 mg/dL   Alkaline Phosphatase 39 39 - 117 U/L   AST 13 0 - 37 U/L   ALT 8 0 - 35 U/L   Total Protein 7.1 6.0 - 8.3 g/dL   Albumin 4.1 3.5 - 5.2 g/dL   Calcium 9.6 8.4 - 10.5 mg/dL   GFR 136.02 >60.00 mL/min  CBC with Differential/Platelet  Result Value Ref Range   WBC 4.6 4.0 - 10.5 K/uL   RBC 4.17 3.87 - 5.11 Mil/uL   Hemoglobin 13.0 12.0 - 15.0 g/dL   HCT 38.2 36.0 - 46.0 %   MCV 91.6 78.0 - 100.0 fl   MCHC 34.0 30.0 - 36.0 g/dL   RDW 13.0 11.5 - 15.5 %   Platelets 325.0 150.0 - 400.0 K/uL   Neutrophils Relative % 67.7 43.0 - 77.0 %   Lymphocytes Relative 25.6 12.0 - 46.0 %   Monocytes Relative 6.2 3.0 - 12.0 %   Eosinophils Relative 0.3 0.0 - 5.0 %   Basophils Relative 0.2 0.0 - 3.0 %   Neutro Abs 3.1 1.4 - 7.7 K/uL   Lymphs Abs 1.2 0.7 - 4.0 K/uL   Monocytes Absolute 0.3 0.1 - 1.0 K/uL   Eosinophils Absolute 0.0 0.0 - 0.7 K/uL   Basophils Absolute 0.0 0.0 - 0.1 K/uL   Assessment & Plan:   Problem List Items Addressed This Visit    Seizure disorder ([place])    Stable on trileptal without recurrent seizure (lat 2017). Labs tomorrow.       Relevant Medications   oxcarbazepine (TRILEPTAL) 600 MG tablet   Health maintenance examination - Primary    Preventative protocols reviewed and updated unless pt declined. Discussed healthy diet and lifestyle.           Meds ordered this encounter  Medications  . oxcarbazepine (TRILEPTAL) 600 MG tablet    Sig: TAKE 1.5 TABLETS BY MOUTH TWO TIMES A DAY    Dispense:  270 tablet    Refill:  3  . Multiple Vitamins-Minerals (ONE DAILY MULTIVITAMIN WOMEN) TABS    Sig: Take 1 tablet by mouth daily.    Dispense:  90 tablet    Refill:  3  . tiZANidine (ZANAFLEX) 2 MG tablet    Sig: Take 1 tablet (2 mg total) by mouth every 6 (six) hours  as needed for muscle spasms.    Dispense:  30 tablet    Refill:  0   No orders of the defined types were placed in this encounter.   I discussed the assessment and treatment plan with the patient. The patient was provided an opportunity to ask questions and all were answered. The patient agreed with the plan and demonstrated an understanding of the instructions. The patient was advised to  call back or seek an in-person evaluation if the symptoms worsen or if the condition fails to improve as anticipated.  Follow up plan: Return in about 1 year (around 06/22/2020) for annual exam, prior fasting for blood work.  Eustaquio BoydenJavier Marylon Verno, MD

## 2019-06-23 NOTE — Assessment & Plan Note (Addendum)
Stable on trileptal without recurrent seizure (lat 2017). Labs tomorrow.

## 2019-06-23 NOTE — Assessment & Plan Note (Signed)
Preventative protocols reviewed and updated unless pt declined. Discussed healthy diet and lifestyle.  

## 2019-06-24 ENCOUNTER — Other Ambulatory Visit (INDEPENDENT_AMBULATORY_CARE_PROVIDER_SITE_OTHER): Payer: Managed Care, Other (non HMO)

## 2019-06-24 DIAGNOSIS — G40909 Epilepsy, unspecified, not intractable, without status epilepticus: Secondary | ICD-10-CM

## 2019-06-24 LAB — CBC WITH DIFFERENTIAL/PLATELET
Basophils Absolute: 0 10*3/uL (ref 0.0–0.1)
Basophils Relative: 0.4 % (ref 0.0–3.0)
Eosinophils Absolute: 0 10*3/uL (ref 0.0–0.7)
Eosinophils Relative: 0.3 % (ref 0.0–5.0)
HCT: 37.7 % (ref 36.0–46.0)
Hemoglobin: 12.8 g/dL (ref 12.0–15.0)
Lymphocytes Relative: 32.2 % (ref 12.0–46.0)
Lymphs Abs: 1.8 10*3/uL (ref 0.7–4.0)
MCHC: 33.8 g/dL (ref 30.0–36.0)
MCV: 92.1 fl (ref 78.0–100.0)
Monocytes Absolute: 0.4 10*3/uL (ref 0.1–1.0)
Monocytes Relative: 7.2 % (ref 3.0–12.0)
Neutro Abs: 3.3 10*3/uL (ref 1.4–7.7)
Neutrophils Relative %: 59.9 % (ref 43.0–77.0)
Platelets: 340 10*3/uL (ref 150.0–400.0)
RBC: 4.1 Mil/uL (ref 3.87–5.11)
RDW: 13 % (ref 11.5–15.5)
WBC: 5.5 10*3/uL (ref 4.0–10.5)

## 2019-06-24 LAB — COMPREHENSIVE METABOLIC PANEL
ALT: 14 U/L (ref 0–35)
AST: 14 U/L (ref 0–37)
Albumin: 4.5 g/dL (ref 3.5–5.2)
Alkaline Phosphatase: 41 U/L (ref 39–117)
BUN: 12 mg/dL (ref 6–23)
CO2: 29 mEq/L (ref 19–32)
Calcium: 9.5 mg/dL (ref 8.4–10.5)
Chloride: 105 mEq/L (ref 96–112)
Creatinine, Ser: 0.73 mg/dL (ref 0.40–1.20)
GFR: 112.86 mL/min (ref >60.00)
Glucose, Bld: 83 mg/dL (ref 70–99)
Potassium: 4.4 mEq/L (ref 3.5–5.1)
Sodium: 140 mEq/L (ref 135–145)
Total Bilirubin: 0.3 mg/dL (ref 0.2–1.2)
Total Protein: 7.1 g/dL (ref 6.0–8.3)

## 2019-06-24 LAB — TSH: TSH: 0.79 u[IU]/mL (ref 0.35–4.50)

## 2019-12-27 ENCOUNTER — Ambulatory Visit: Payer: Managed Care, Other (non HMO) | Attending: Internal Medicine

## 2019-12-27 DIAGNOSIS — Z20822 Contact with and (suspected) exposure to covid-19: Secondary | ICD-10-CM

## 2019-12-29 LAB — NOVEL CORONAVIRUS, NAA: SARS-CoV-2, NAA: NOT DETECTED

## 2020-01-19 ENCOUNTER — Encounter: Payer: Self-pay | Admitting: Family Medicine

## 2020-01-19 ENCOUNTER — Ambulatory Visit (INDEPENDENT_AMBULATORY_CARE_PROVIDER_SITE_OTHER): Payer: Managed Care, Other (non HMO) | Admitting: Family Medicine

## 2020-01-19 ENCOUNTER — Other Ambulatory Visit: Payer: Self-pay

## 2020-01-19 VITALS — BP 112/64 | HR 96 | Temp 97.9°F | Ht 59.75 in | Wt 89.1 lb

## 2020-01-19 DIAGNOSIS — Z5181 Encounter for therapeutic drug level monitoring: Secondary | ICD-10-CM | POA: Diagnosis not present

## 2020-01-19 DIAGNOSIS — Z0000 Encounter for general adult medical examination without abnormal findings: Secondary | ICD-10-CM

## 2020-01-19 DIAGNOSIS — G40909 Epilepsy, unspecified, not intractable, without status epilepticus: Secondary | ICD-10-CM | POA: Diagnosis not present

## 2020-01-19 DIAGNOSIS — Z8673 Personal history of transient ischemic attack (TIA), and cerebral infarction without residual deficits: Secondary | ICD-10-CM | POA: Diagnosis not present

## 2020-01-19 LAB — CBC WITH DIFFERENTIAL/PLATELET
Basophils Absolute: 0 10*3/uL (ref 0.0–0.1)
Basophils Relative: 0.3 % (ref 0.0–3.0)
Eosinophils Absolute: 0 10*3/uL (ref 0.0–0.7)
Eosinophils Relative: 0.2 % (ref 0.0–5.0)
HCT: 35.5 % — ABNORMAL LOW (ref 36.0–46.0)
Hemoglobin: 12 g/dL (ref 12.0–15.0)
Lymphocytes Relative: 27.5 % (ref 12.0–46.0)
Lymphs Abs: 1.6 10*3/uL (ref 0.7–4.0)
MCHC: 33.7 g/dL (ref 30.0–36.0)
MCV: 91.6 fl (ref 78.0–100.0)
Monocytes Absolute: 0.4 10*3/uL (ref 0.1–1.0)
Monocytes Relative: 7.4 % (ref 3.0–12.0)
Neutro Abs: 3.7 10*3/uL (ref 1.4–7.7)
Neutrophils Relative %: 64.6 % (ref 43.0–77.0)
Platelets: 325 10*3/uL (ref 150.0–400.0)
RBC: 3.88 Mil/uL (ref 3.87–5.11)
RDW: 13.1 % (ref 11.5–15.5)
WBC: 5.7 10*3/uL (ref 4.0–10.5)

## 2020-01-19 LAB — COMPREHENSIVE METABOLIC PANEL
ALT: 16 U/L (ref 0–35)
AST: 13 U/L (ref 0–37)
Albumin: 4.2 g/dL (ref 3.5–5.2)
Alkaline Phosphatase: 42 U/L (ref 39–117)
BUN: 13 mg/dL (ref 6–23)
CO2: 27 mEq/L (ref 19–32)
Calcium: 9.2 mg/dL (ref 8.4–10.5)
Chloride: 107 mEq/L (ref 96–112)
Creatinine, Ser: 0.65 mg/dL (ref 0.40–1.20)
GFR: 128.56 mL/min (ref >60.00)
Glucose, Bld: 81 mg/dL (ref 70–99)
Potassium: 3.5 mEq/L (ref 3.5–5.1)
Sodium: 138 mEq/L (ref 135–145)
Total Bilirubin: 0.3 mg/dL (ref 0.2–1.2)
Total Protein: 7.2 g/dL (ref 6.0–8.3)

## 2020-01-19 LAB — TSH: TSH: 0.55 u[IU]/mL (ref 0.35–4.50)

## 2020-01-19 MED ORDER — ONE DAILY MULTIVITAMIN WOMEN PO TABS: 1.0000 | ORAL_TABLET | Freq: Every day | ORAL | 3 refills | Status: DC

## 2020-01-19 MED ORDER — OXCARBAZEPINE 600 MG PO TABS: 900.0000 mg | ORAL_TABLET | Freq: Two times a day (BID) | ORAL | 3 refills | Status: DC

## 2020-01-19 MED ORDER — TIZANIDINE HCL 2 MG PO TABS: 2.0000 mg | ORAL_TABLET | Freq: Three times a day (TID) | ORAL | 6 refills | Status: DC | PRN

## 2020-01-19 NOTE — Assessment & Plan Note (Signed)
Stable period on trileptal - continue.

## 2020-01-19 NOTE — Assessment & Plan Note (Signed)
Preventative protocols reviewed and updated unless pt declined. Discussed healthy diet and lifestyle.  

## 2020-01-19 NOTE — Progress Notes (Signed)
This visit was conducted in person.  BP 112/64 (BP Location: Left Arm, Patient Position: Sitting, Cuff Size: Normal)   Pulse 96   Temp 97.9 F (36.6 C) (Temporal)   Ht 4' 11.75" (1.518 m)   Wt 89 lb 1 oz (40.4 kg)   LMP 01/16/2020   SpO2 96%   BMI 17.54 kg/m    CC: CPE Subjective:    Patient ID: Lori Ballard, female    DOB: 1986/03/20, 34 y.o.   MRN: [id number]  HPI: Lori Ballard is a 34 y.o. 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 Also takes MVI daily. Last seizure 06/2016 - thinks may have missed a dose of AED.Drives. Independent in ADLs.   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   [facility]ative[facility] Dr [facility], considering birth control options. Normal pap smear 01/2018. Will call to reschedule. LMP 01/16/2020, regular cycles. Declines flu shot.  Tetanus shot - declines  Seat belt use discussed Sunscreen use discussed.No changing moles on skin.  Non smoker Alcohol - none  Dentist q6 mo  Eye exam yearly  Currently sexually active - not on birth control.   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 Interim medical history since our last visit reviewed. Allergies and medications reviewed and updated. Outpatient Medications Prior to Visit  Medication Sig Dispense Refill  . Multiple Vitamins-Minerals (ONE DAILY MULTIVITAMIN WOMEN) TABS Take 1 tablet by mouth daily. 90 tablet 3  . oxcarbazepine (TRILEPTAL) 600 MG tablet TAKE 1.5 TABLETS BY MOUTH TWO TIMES A DAY 270 tablet 3  . tiZANidine (ZANAFLEX) 2 MG tablet Take 1 tablet (2 mg total) by mouth every 6 (six) hours as needed for  muscle spasms. 30 tablet 0   No facility-administered medications prior to visit.     Per HPI unless specifically indicated in ROS section below Review of Systems  Constitutional: Negative for activity change, appetite change, chills, fatigue, fever and unexpected weight change.  HENT: Negative for hearing loss.   Eyes: Negative for visual disturbance.  Respiratory: Negative for cough, chest tightness, shortness of breath and wheezing.   Cardiovascular: Negative for chest pain, palpitations and leg swelling.  Gastrointestinal: Negative for abdominal distention, abdominal pain, blood in stool, constipation, diarrhea, nausea and vomiting.  Genitourinary: Negative for difficulty urinating and hematuria.  Musculoskeletal: Negative for arthralgias, myalgias and neck pain.  Skin: Negative for rash.  Neurological: Negative for dizziness, seizures, syncope and headaches.  Hematological: Negative for adenopathy. Does not bruise/bleed easily.  Psychiatric/Behavioral: Negative for dysphoric mood. The patient is not nervous/anxious.    Objective:    BP 112/64 (BP Location: Left Arm, Patient Position: Sitting, Cuff Size: Normal)   Pulse 96   Temp 97.9 F (36.6 C) (Temporal)   Ht 4' 11.75" (1.518 m)   Wt 89 lb 1 oz (40.4 kg)   LMP 01/16/2020   SpO2 96%   BMI 17.54 kg/m   Wt Readings from Last 3 Encounters:  01/19/20 89 lb 1 oz (40.4 kg)  06/23/19 92 lb (41.7 kg)  09/13/18 88 lb 12 oz (40.3 kg)    Physical Exam Vitals and nursing note  reviewed.  Constitutional:      General: She is not in acute distress.    Appearance: Normal appearance. She is well-developed and underweight. She is not ill-appearing.  HENT:     Head: Normocephalic and atraumatic.     Right Ear: Hearing, tympanic membrane, ear canal and external ear normal.     Left Ear: Hearing, tympanic membrane, ear canal and external ear normal.     Mouth/Throat:     Pharynx: Uvula midline.  Eyes:     General: No scleral  icterus.    Extraocular Movements: Extraocular movements intact.     Conjunctiva/sclera: Conjunctivae normal.     Pupils: Pupils are equal, round, and reactive to light.  Cardiovascular:     Rate and Rhythm: Normal rate and regular rhythm.     Pulses: Normal pulses.          Radial pulses are 2+ on the right side and 2+ on the left side.     Heart sounds: Normal heart sounds. No murmur.  Pulmonary:     Effort: Pulmonary effort is normal. No respiratory distress.     Breath sounds: Normal breath sounds. No wheezing, rhonchi or rales.  Abdominal:     General: Abdomen is flat. Bowel sounds are normal. There is no distension.     Palpations: Abdomen is soft. There is no mass.     Tenderness: There is no abdominal tenderness. There is no guarding or rebound.     Hernia: No hernia is present.  Musculoskeletal:        General: Normal range of motion.     Cervical back: Normal range of motion and neck supple.     Right lower leg: No edema.     Left lower leg: No edema.  Lymphadenopathy:     Cervical: No cervical adenopathy.  Skin:    General: Skin is warm and dry.     Findings: No rash.  Neurological:     General: No focal deficit present.     Mental Status: She is alert and oriented to person, place, and time.     Comments:  CN grossly intact, station and gait intact Chronic R hemiparesis  Psychiatric:        Mood and Affect: Mood normal.        Behavior: Behavior normal.        Thought Content: Thought content normal.        Judgment: Judgment normal.       Results for orders placed or performed in visit on 12/27/19  Novel Coronavirus, NAA ([facility])   Specimen: Nasopharyngeal(NP) swabs in vial transport medium   NASOPHARYNGE  TESTING  Result Value Ref Range   SARS-CoV-2, NAA Not Detected Not Detected   Assessment & Plan:  This visit occurred during the SARS-CoV-2 public health emergency.  Safety protocols were in place, including screening questions prior to the visit,  additional usage of staff PPE, and extensive cleaning of exam room while observing appropriate contact time as indicated for disinfecting solutions.   Problem List Items Addressed This Visit    Therapeutic drug monitoring   Relevant Orders   Comprehensive metabolic panel   TSH   CBC with Differential/Platelet   Seizure disorder ([facility])    Stable period on trileptal - continue.       Relevant Medications   oxcarbazepine (TRILEPTAL) 600 MG tablet   Other Relevant Orders   Comprehensive metabolic panel   TSH   CBC with Differential/Platelet   History  of stroke    In utero, residual R hemiparesis      Health maintenance examination - Primary    Preventative protocols reviewed and updated unless pt declined. Discussed healthy diet and lifestyle.           Meds ordered this encounter  Medications  . Multiple Vitamins-Minerals (ONE DAILY MULTIVITAMIN WOMEN) TABS    Sig: Take 1 tablet by mouth daily.    Dispense:  90 tablet    Refill:  3  . oxcarbazepine (TRILEPTAL) 600 MG tablet    Sig: Take 1.5 tablets (900 mg total) by mouth 2 (two) times daily.    Dispense:  270 tablet    Refill:  3  . tiZANidine (ZANAFLEX) 2 MG tablet    Sig: Take 1 tablet (2 mg total) by mouth every 8 (eight) hours as needed for muscle spasms.    Dispense:  30 tablet    Refill:  6   Orders Placed This Encounter  Procedures  . Comprehensive metabolic panel  . TSH  . CBC with Differential/Platelet    Patient instructions: Call to schedule well woman exam.  You are doing well today Return as needed or in 1 year for next physical.   Follow up plan: Return in about 1 year (around 01/18/2021) for annual exam, prior fasting for blood work.  Eustaquio Boyden, MD

## 2020-01-19 NOTE — Patient Instructions (Addendum)
Call to schedule well woman exam.  You are doing well today Return as needed or in 1 year for next physical.   Health Maintenance, Female Adopting a healthy lifestyle and getting preventive care are important in promoting health and wellness. Ask your health care provider about:  The right schedule for you to have regular tests and exams.  Things you can do on your own to prevent diseases and keep yourself healthy. What should I know about diet, weight, and exercise? Eat a healthy diet   Eat a diet that includes plenty of vegetables, fruits, low-fat dairy products, and lean protein.  Do not eat a lot of foods that are high in solid fats, added sugars, or sodium. Maintain a healthy weight Body mass index (BMI) is used to identify weight problems. It estimates body fat based on height and weight. Your health care provider can help determine your BMI and help you achieve or maintain a healthy weight. Get regular exercise Get regular exercise. This is one of the most important things you can do for your health. Most adults should:  Exercise for at least 150 minutes each week. The exercise should increase your heart rate and make you sweat (moderate-intensity exercise).  Do strengthening exercises at least twice a week. This is in addition to the moderate-intensity exercise.  Spend less time sitting. Even light physical activity can be beneficial. Watch cholesterol and blood lipids Have your blood tested for lipids and cholesterol at 32 years of age, then have this test every 5 years. Have your cholesterol levels checked more often if:  Your lipid or cholesterol levels are high.  You are older than 32 years of age.  You are at high risk for heart disease. What should I know about cancer screening? Depending on your health history and family history, you may need to have cancer screening at various ages. This may include screening for:  Breast cancer.  Cervical  cancer.  Colorectal cancer.  Skin cancer.  Lung cancer. What should I know about heart disease, diabetes, and high blood pressure? Blood pressure and heart disease  High blood pressure causes heart disease and increases the risk of stroke. This is more likely to develop in people who have high blood pressure readings, are of African descent, or are overweight.  Have your blood pressure checked: ? Every 3-5 years if you are 20-56 years of age. ? Every year if you are 54 years old or older. Diabetes Have regular diabetes screenings. This checks your fasting blood sugar level. Have the screening done:  Once every three years after age 24 if you are at a normal weight and have a low risk for diabetes.  More often and at a younger age if you are overweight or have a high risk for diabetes. What should I know about preventing infection? Hepatitis B If you have a higher risk for hepatitis B, you should be screened for this virus. Talk with your health care provider to find out if you are at risk for hepatitis B infection. Hepatitis C Testing is recommended for:  Everyone born from [birth year] through 1965.  Anyone with known risk factors for hepatitis C. Sexually transmitted infections (STIs)  Get screened for STIs, including gonorrhea and chlamydia, if: ? You are sexually active and are younger than 32 years of age. ? You are older than 32 years of age and your health care provider tells you that you are at risk for this type of infection. ? Your sexual  activity has changed since you were last screened, and you are at increased risk for chlamydia or gonorrhea. Ask your health care provider if you are at risk.  Ask your health care provider about whether you are at high risk for HIV. Your health care provider may recommend a prescription medicine to help prevent HIV infection. If you choose to take medicine to prevent HIV, you should first get tested for HIV. You should then be tested every 3  months for as long as you are taking the medicine. Pregnancy  If you are about to stop having your period (premenopausal) and you may become pregnant, seek counseling before you get pregnant.  Take 400 to 800 micrograms (mcg) of folic acid every day if you become pregnant.  Ask for birth control (contraception) if you want to prevent pregnancy. Osteoporosis and menopause Osteoporosis is a disease in which the bones lose minerals and strength with aging. This can result in bone fractures. If you are 30 years old or older, or if you are at risk for osteoporosis and fractures, ask your health care provider if you should:  Be screened for bone loss.  Take a calcium or vitamin D supplement to lower your risk of fractures.  Be given hormone replacement therapy (HRT) to treat symptoms of menopause. Follow these instructions at home: Lifestyle  Do not use any products that contain nicotine or tobacco, such as cigarettes, e-cigarettes, and chewing tobacco. If you need help quitting, ask your health care provider.  Do not use street drugs.  Do not share needles.  Ask your health care provider for help if you need support or information about quitting drugs. Alcohol use  Do not drink alcohol if: ? Your health care provider tells you not to drink. ? You are pregnant, may be pregnant, or are planning to become pregnant.  If you drink alcohol: ? Limit how much you use to 0-1 drink a day. ? Limit intake if you are breastfeeding.  Be aware of how much alcohol is in your drink. In the [place] one drink equals one 12 oz bottle of beer (355 mL), one 5 oz glass of wine (148 mL), or one 1 oz glass of hard liquor (44 mL). General instructions  Schedule regular health, dental, and eye exams.  Stay current with your vaccines.  Tell your health care provider if: ? You often feel depressed. ? You have ever been abused or do not feel safe at home. Summary  Adopting a healthy lifestyle and getting  preventive care are important in promoting health and wellness.  Follow your health care provider's instructions about healthy diet, exercising, and getting tested or screened for diseases.  Follow your health care provider's instructions on monitoring your cholesterol and blood pressure. This information is not intended to replace advice given to you by your health care provider. Make sure you discuss any questions you have with your health care provider. Document Revised: 11/24/2018 Document Reviewed: 11/24/2018 Elsevier Patient Education  2020 [employer].

## 2020-01-19 NOTE — Assessment & Plan Note (Signed)
In utero, residual R hemiparesis

## 2021-01-28 ENCOUNTER — Other Ambulatory Visit: Payer: Self-pay | Admitting: Family Medicine

## 2021-01-28 NOTE — Telephone Encounter (Signed)
Pharmacy requests refill on: Oxcarbazepine 600 mg   LAST REFILL: 01/19/2020 (Q-270, R-3) LAST OV: 01/19/2020 NEXT OV: 03/04/2021 PHARMACY: [facility] [place], [place]

## 2021-01-28 NOTE — Telephone Encounter (Signed)
[facility] Night - Client TELEPHONE ADVICE RECORD Access[employer]urse Patient Name: Lori Ballard Gender: Female DOB: 05/27/1986 Age: 35 Y 1 M 16 D Return Phone Number: [phone] (Primary), [phone] (Secondary) Address[place] [address] Client [facility] Night - Client Client Site  [facility] - Night Physician Eustaquio Boyden - MD Contact Type Call Who Is Calling Patient / Member / Family / Caregiver Call Type Triage / Clinical Relationship To Patient Self Return Phone Number [phone] (Primary) Chief Complaint Prescription Refill or Medication Request (non symptomatic) Reason for Call Medication Question / Request Initial Comment Caller ran out of her Rx and isn't scheduled to see Dr. Sharen Hones until next month but she is out of her daily preventative seizure Rx. Asks for an emergency dose Rx processing. No current sxs. Translation No Nurse Assessment Nurse: Yetta Barre, RN, Miranda Date/Time (Eastern Time): 01/28/2021 7:46:11 AM Please select the assessment type ---Refill Additional Documentation ---Caller states she is going to be out of her seizure medication before her next appt. She wants to know if the doctor will give her enough medication to cover her until the appt. Does the patient have enough medication to last until the office opens? ---Yes Additional Documentation ---Told caller she would need to call the office back when they open at 8am Disp. Time Lamount Cohen Time) Disposition Final User 01/28/2021 7:48:43 AM Clinical Call Yes Yetta Barre, RN, Tamera Punt

## 2021-01-28 NOTE — Telephone Encounter (Signed)
ERx 

## 2021-03-04 ENCOUNTER — Encounter: Payer: Managed Care, Other (non HMO) | Admitting: Family Medicine

## 2021-03-08 ENCOUNTER — Encounter: Payer: Managed Care, Other (non HMO) | Admitting: Family Medicine

## 2021-03-19 ENCOUNTER — Ambulatory Visit (INDEPENDENT_AMBULATORY_CARE_PROVIDER_SITE_OTHER): Payer: 59

## 2021-03-19 ENCOUNTER — Other Ambulatory Visit: Payer: Self-pay

## 2021-03-19 DIAGNOSIS — Z111 Encounter for screening for respiratory tuberculosis: Secondary | ICD-10-CM | POA: Diagnosis not present

## 2021-03-19 NOTE — Progress Notes (Signed)
PPD Placement note Lori Ballard, 35 y.o. female is here today for placement of PPD test Reason for PPD test: Work Pt taken PPD test before: yes Verified in allergy area and with patient that they are not allergic to the products PPD is made of (Phenol or Tween). Yes Is patient taking any oral or IV steroid medication now or have they taken it in the last month? no Has the patient ever received the BCG vaccine?: no Has the patient been in recent contact with anyone known or suspected of having active TB disease?: no      Date of exposure (if applicable): N/A      Name of person they were exposed to (if applicable): N/A Patient's Country of origin?: [place] O: Alert and oriented in NAD. P:  PPD placed on 03/19/2021.  Patient advised to return for reading within 48-72 hours.

## 2021-03-21 ENCOUNTER — Ambulatory Visit: Payer: 59

## 2021-03-21 ENCOUNTER — Ambulatory Visit: Payer: Managed Care, Other (non HMO)

## 2021-03-21 ENCOUNTER — Other Ambulatory Visit: Payer: Self-pay

## 2021-03-21 DIAGNOSIS — Z111 Encounter for screening for respiratory tuberculosis: Secondary | ICD-10-CM

## 2021-03-21 LAB — TB SKIN TEST
Induration: 0 mm
TB Skin Test: NEGATIVE

## 2021-04-24 ENCOUNTER — Other Ambulatory Visit: Payer: Self-pay | Admitting: Family Medicine

## 2021-04-24 NOTE — Telephone Encounter (Signed)
Refill request Trileptal Last refill 01/28/21 #270 Last office visit 01/19/20 Upcoming appointment 04/29/21

## 2021-04-29 ENCOUNTER — Encounter: Payer: Managed Care, Other (non HMO) | Admitting: Family Medicine

## 2021-04-30 ENCOUNTER — Telehealth: Payer: Self-pay | Admitting: Family Medicine

## 2021-04-30 NOTE — Telephone Encounter (Signed)
Pt missed CPE yesterday - please call to reschedule - needs OV for refill of antiseizure medication.

## 2021-04-30 NOTE — Telephone Encounter (Signed)
Called and left vm for the patient to call and schedule office visit. EM

## 2021-07-19 ENCOUNTER — Other Ambulatory Visit: Payer: Self-pay

## 2021-07-22 ENCOUNTER — Ambulatory Visit (INDEPENDENT_AMBULATORY_CARE_PROVIDER_SITE_OTHER): Payer: Self-pay | Admitting: Family Medicine

## 2021-07-22 ENCOUNTER — Encounter: Payer: Self-pay | Admitting: Family Medicine

## 2021-07-22 ENCOUNTER — Other Ambulatory Visit: Payer: Self-pay

## 2021-07-22 VITALS — BP 112/64 | HR 91 | Temp 98.6°F | Ht 59.75 in | Wt 82.3 lb

## 2021-07-22 DIAGNOSIS — Z0000 Encounter for general adult medical examination without abnormal findings: Secondary | ICD-10-CM

## 2021-07-22 DIAGNOSIS — G40909 Epilepsy, unspecified, not intractable, without status epilepticus: Secondary | ICD-10-CM

## 2021-07-22 DIAGNOSIS — R636 Underweight: Secondary | ICD-10-CM

## 2021-07-22 DIAGNOSIS — R21 Rash and other nonspecific skin eruption: Secondary | ICD-10-CM

## 2021-07-22 DIAGNOSIS — I639 Cerebral infarction, unspecified: Secondary | ICD-10-CM

## 2021-07-22 DIAGNOSIS — G801 Spastic diplegic cerebral palsy: Secondary | ICD-10-CM

## 2021-07-22 DIAGNOSIS — G40209 Localization-related (focal) (partial) symptomatic epilepsy and epileptic syndromes with complex partial seizures, not intractable, without status epilepticus: Secondary | ICD-10-CM

## 2021-07-22 LAB — CBC WITH DIFFERENTIAL/PLATELET
Basophils Absolute: 0 10*3/uL (ref 0.0–0.1)
Basophils Relative: 0.3 % (ref 0.0–3.0)
Eosinophils Absolute: 0 10*3/uL (ref 0.0–0.7)
Eosinophils Relative: 0.2 % (ref 0.0–5.0)
HCT: 36.6 % (ref 36.0–46.0)
Hemoglobin: 12.3 g/dL (ref 12.0–15.0)
Lymphocytes Relative: 26.4 % (ref 12.0–46.0)
Lymphs Abs: 1.4 10*3/uL (ref 0.7–4.0)
MCHC: 33.6 g/dL (ref 30.0–36.0)
MCV: 91.2 fl (ref 78.0–100.0)
Monocytes Absolute: 0.4 10*3/uL (ref 0.1–1.0)
Monocytes Relative: 7.7 % (ref 3.0–12.0)
Neutro Abs: 3.5 10*3/uL (ref 1.4–7.7)
Neutrophils Relative %: 65.4 % (ref 43.0–77.0)
Platelets: 350 10*3/uL (ref 150.0–400.0)
RBC: 4.01 Mil/uL (ref 3.87–5.11)
RDW: 13.4 % (ref 11.5–15.5)
WBC: 5.4 10*3/uL (ref 4.0–10.5)

## 2021-07-22 LAB — COMPREHENSIVE METABOLIC PANEL
ALT: 10 U/L (ref 0–35)
AST: 12 U/L (ref 0–37)
Albumin: 4.3 g/dL (ref 3.5–5.2)
Alkaline Phosphatase: 49 U/L (ref 39–117)
BUN: 9 mg/dL (ref 6–23)
CO2: 28 mEq/L (ref 19–32)
Calcium: 9.4 mg/dL (ref 8.4–10.5)
Chloride: 104 mEq/L (ref 96–112)
Creatinine, Ser: 0.66 mg/dL (ref 0.40–1.20)
GFR: 115.92 mL/min (ref >60.00)
Glucose, Bld: 81 mg/dL (ref 70–99)
Potassium: 4 mEq/L (ref 3.5–5.1)
Sodium: 138 mEq/L (ref 135–145)
Total Bilirubin: 0.3 mg/dL (ref 0.2–1.2)
Total Protein: 7.2 g/dL (ref 6.0–8.3)

## 2021-07-22 LAB — TSH: TSH: 0.76 u[IU]/mL (ref 0.35–5.50)

## 2021-07-22 MED ORDER — ONE DAILY MULTIVITAMIN WOMEN PO TABS: 1.0000 | ORAL_TABLET | Freq: Every day | ORAL | 3 refills | Status: DC

## 2021-07-22 MED ORDER — TIZANIDINE HCL 2 MG PO TABS: 2.0000 mg | ORAL_TABLET | Freq: Three times a day (TID) | ORAL | 3 refills | Status: DC | PRN

## 2021-07-22 MED ORDER — OXCARBAZEPINE 600 MG PO TABS: ORAL_TABLET | ORAL | 3 refills | Status: DC

## 2021-07-22 NOTE — Progress Notes (Signed)
Patient ID: Lori Ballard, female    DOB: [birth year], 35 y.o.   MRN: [id number]  This visit was conducted in person.  BP 112/64   Pulse 91   Temp 98.6 F (37 C) (Temporal)   Ht 4' 11.75" (1.518 m)   Wt 82 lb 5 oz (37.3 kg)   LMP 07/11/2021   SpO2 98%   BMI 16.21 kg/m    CC: CPE Subjective:   HPI: Lori Stallman Siegfried is a 35 y.o. female presenting on 07/22/2021 for Annual Exam   Tough year - lost job this year then dog passed away.  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 Also takes MVI daily. Last seizure 06/2016 - after missing a dose of AED. Doesn't see neurology - last saw [facility] - note from 2005 reviewed. Doesn't think she's ever had EEG. Drives. Independent in ADLs. Latest MRI reviewed ([facility] 2005)  Weight loss noted - endorses decreased appetite ?med related.    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    Peeling skin rash present intermittently to forehead for years - worse with sun exposure.   Pre[facility]ative: [facility] Dr Macon LargeAnyanwu, considering birth control options. Normal pap smear 01/2018. Will call to reschedule.  LMP 07/11/2021, regular cycles.  Declines flu shot  COVID vaccine - interested, discussed.  Tetanus shot - declines  Seat belt use discussed Sunscreen use discussed. No changing moles on skin.  Non smoker Alcohol - none  Rec drugs - none Dentist q6 mo Eye exam yearly  Caffeine: none Lives with parents, only child, 1 dog (shitzu) Occupation: works at [employer]  Activity: no regular exercise - stays active at work  Diet: good water, fruits/vegetables daily, 3 meals a day     Relevant past medical, surgical, family and social history reviewed and updated as indicated. Interim medical history since our last visit  reviewed. Allergies and medications reviewed and updated. Outpatient Medications Prior to Visit  Medication Sig Dispense Refill   Multiple Vitamins-Minerals (ONE DAILY MULTIVITAMIN WOMEN) TABS Take 1 tablet by mouth daily. 90 tablet 3   oxcarbazepine (TRILEPTAL) 600 MG tablet TAKE 1.5 TABLETS BY MOUTH TWO TIMES A DAY 270 tablet 0   tiZANidine (ZANAFLEX) 2 MG tablet Take 1 tablet (2 mg total) by mouth every 8 (eight) hours as needed for muscle spasms. 30 tablet 6   No facility-administered medications prior to visit.     Per HPI unless specifically indicated in ROS section below Review of Systems  Constitutional:  Negative for activity change, appetite change, chills, fatigue, fever and unexpected weight change.  HENT:  Negative for hearing loss.   Eyes:  Negative for visual disturbance.  Respiratory:  Negative for cough, chest tightness, shortness of breath and wheezing.   Cardiovascular:  Negative for chest pain, palpitations and leg swelling.  Gastrointestinal:  Negative for abdominal distention, abdominal pain, blood in stool, constipation, diarrhea, nausea and vomiting.  Genitourinary:  Negative for difficulty urinating and hematuria.  Musculoskeletal:  Negative for arthralgias, myalgias and neck pain.  Skin:  Negative for rash.  Neurological:  Negative for dizziness, seizures, syncope and headaches.  Hematological:  Negative for adenopathy. Does not bruise/bleed easily.  Psychiatric/Behavioral:  Negative for dysphoric mood. The patient is not nervous/anxious.    Objective:  BP 112/64   Pulse 91   Temp 98.6 F (37 C) (Temporal)   Ht 4' 11.75" (1.518 m)   Wt 82 lb 5 oz (37.3 kg)   LMP 07/11/2021   SpO2 98%   BMI 16.21 kg/m   Wt Readings from Last 3 Encounters:  07/22/21 82 lb 5 oz (37.3 kg)  01/19/20 89 lb 1 oz (40.4 kg)  06/23/19 92 lb (41.7 kg)      Physical Exam Vitals and nursing note reviewed.  Constitutional:      Appearance: Normal appearance. She is  underweight. She is not ill-appearing.  HENT:     Head: Normocephalic and atraumatic.     Right Ear: Tympanic membrane, ear canal and external ear normal. There is no impacted cerumen.     Left Ear: Tympanic membrane, ear canal and external ear normal. There is no impacted cerumen.  Eyes:     General:        Right eye: No discharge.        Left eye: No discharge.     Extraocular Movements: Extraocular movements intact.     Conjunctiva/sclera: Conjunctivae normal.     Pupils: Pupils are equal, round, and reactive to light.  Neck:     Thyroid: No thyroid mass or thyromegaly.  Cardiovascular:     Rate and Rhythm: Normal rate and regular rhythm.     Pulses: Normal pulses.     Heart sounds: Normal heart sounds. No murmur heard. Pulmonary:     Effort: Pulmonary effort is normal. No respiratory distress.     Breath sounds: Normal breath sounds. No wheezing, rhonchi or rales.  Abdominal:     General: Bowel sounds are normal. There is no distension.     Palpations: Abdomen is soft. There is no mass.     Tenderness: no abdominal tenderness There is no guarding or rebound.     Hernia: No hernia is present.  Musculoskeletal:     Cervical back: Normal range of motion and neck supple. No rigidity.     Right lower leg: No edema.     Left lower leg: No edema.  Lymphadenopathy:     Cervical: No cervical adenopathy.  Skin:    General: Skin is warm and dry.     Findings: Rash present. Rash is scaling.     Comments:  Peeling macular rash on forehead - skin scrapings collected for KOH Rest of skin not affected  Neurological:     General: No focal deficit present.     Mental Status: She is alert. Mental status is at baseline.     Comments: Chronic R hemiparesis  Psychiatric:        Mood and Affect: Mood normal.        Behavior: Behavior normal.      Results for orders placed or performed in visit on 07/22/21  Comprehensive metabolic panel  Result Value Ref Range   Sodium 138 135 - 145  mEq/L   Potassium 4.0 3.5 - 5.1 mEq/L   Chloride 104 96 - 112 mEq/L   CO2 28 19 - 32 mEq/L   Glucose, Bld 81 70 - 99 mg/dL   BUN 9 6 - 23 mg/dL   Creatinine, Ser 6.38 0.40 - 1.20 mg/dL   Total Bilirubin 0.3 0.2 - 1.2 mg/dL   Alkaline Phosphatase 49 39 - 117 U/L   AST 12 0 - 37 U/L   ALT 10 0 - 35 U/L   Total Protein 7.2 6.0 -  8.3 g/dL   Albumin 4.3 3.5 - 5.2 g/dL   GFR 435.68 >61.68 mL/min   Calcium 9.4 8.4 - 10.5 mg/dL  TSH  Result Value Ref Range   TSH 0.76 0.35 - 5.50 uIU/mL  CBC with Differential/Platelet  Result Value Ref Range   WBC 5.4 4.0 - 10.5 K/uL   RBC 4.01 3.87 - 5.11 Mil/uL   Hemoglobin 12.3 12.0 - 15.0 g/dL   HCT 37.2 90.2 - 11.1 %   MCV 91.2 78.0 - 100.0 fl   MCHC 33.6 30.0 - 36.0 g/dL   RDW 55.2 08.0 - 22.3 %   Platelets 350.0 150.0 - 400.0 K/uL   Neutrophils Relative % 65.4 43.0 - 77.0 %   Lymphocytes Relative 26.4 12.0 - 46.0 %   Monocytes Relative 7.7 3.0 - 12.0 %   Eosinophils Relative 0.2 0.0 - 5.0 %   Basophils Relative 0.3 0.0 - 3.0 %   Neutro Abs 3.5 1.4 - 7.7 K/uL   Lymphs Abs 1.4 0.7 - 4.0 K/uL   Monocytes Absolute 0.4 0.1 - 1.0 K/uL   Eosinophils Absolute 0.0 0.0 - 0.7 K/uL   Basophils Absolute 0.0 0.0 - 0.1 K/uL  POCT Skin KOH  Result Value Ref Range   Skin KOH, POC Negative Negative    Assessment & Plan:  This visit occurred during the SARS-CoV-2 public health emergency.  Safety protocols were in place, including screening questions prior to the visit, additional usage of staff PPE, and extensive cleaning of exam room while observing appropriate contact time as indicated for disinfecting solutions.   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  No EEG in system.  Last seizure was 2017 after missing a dose of tripletal.  Longstanding well controlled on trileptal 900mg  BID - trileptal started 2003. Now with concern for skin rash  to forehead and ongoing anorexia ?med related - will refer to neurology for eval, discussion of risk/benefits of change in AED therapy.       Relevant Medications   oxcarbazepine (TRILEPTAL) 600 MG tablet   Other Relevant Orders   Comprehensive metabolic panel (Completed)   TSH (Completed)   CBC with Differential/Platelet (Completed)   Ambulatory referral to Neurology   Health maintenance examination - Primary    Preventative protocols reviewed and updated unless pt declined. Discussed healthy diet and lifestyle.  No immunization records - I did ask her to bring home records to update chart.       Congenital stroke [facility])   Relevant Orders   Ambulatory referral to Neurology   Underweight    Weight loss noted. Discussed healthy well balanced diet, consideration for protein supplementation.  Check labs.  ?med related anorexia (trilpetal) - see above.       Skin rash    Chronic skin rash to forehead - KOH negative today. ?Trileptal related - will refer to neurology for consideration of change in AED.       Relevant Orders   POCT Skin KOH (Completed)   Spastic cerebral palsy, congenital (HCC)   Relevant Orders   Ambulatory referral to Neurology     Meds ordered this encounter  Medications   oxcarbazepine (TRILEPTAL) 600 MG tablet    Sig: TAKE 1.5 TABLETS BY MOUTH TWO TIMES A DAY    Dispense:  270 tablet    Refill:  3   Multiple Vitamins-Minerals (ONE DAILY MULTIVITAMIN WOMEN)  TABS    Sig: Take 1 tablet by mouth daily.    Dispense:  90 tablet    Refill:  3   tiZANidine (ZANAFLEX) 2 MG tablet    Sig: Take 1 tablet (2 mg total) by mouth every 8 (eight) hours as needed for muscle spasms.    Dispense:  30 tablet    Refill:  3   Orders Placed This Encounter  Procedures   Comprehensive metabolic panel   TSH   CBC with Differential/Platelet   Ambulatory referral to Neurology    Referral Priority:   Routine    Referral Type:   Consultation    Referral Reason:   Specialty  Services Required    Requested Specialty:   Neurology    Number of Visits Requested:   1   POCT Skin KOH    Patient instructions: I do recommend the COVID shot.  Check for immunization records at home and bring [place] copy to update your chart.  Labs today.  Pending lab results we may refer you to neurology for evaluation - see if change in medication is indicated.   Follow up plan: Return in about 1 year (around 07/22/2022), or if symptoms worsen or fail to improve, for annual exam, prior fasting for blood work.  Eustaquio Boyden, MD

## 2021-07-22 NOTE — Patient Instructions (Addendum)
I do recommend the COVID shot.  Check for immunization records at home and bring [place] copy to update your chart.  Labs today.  Pending lab results we may refer you to neurology for evaluation - see if change in medication is indicated.   Health Maintenance, Female Adopting a healthy lifestyle and getting preventive care are important in promoting health and wellness. Ask your health care provider about: The right schedule for you to have regular tests and exams. Things you can do on your own to prevent diseases and keep yourself healthy. What should I know about diet, weight, and exercise? Eat a healthy diet  Eat a diet that includes plenty of vegetables, fruits, low-fat dairy products, and lean protein. Do not eat a lot of foods that are high in solid fats, added sugars, or sodium.  Maintain a healthy weight Body mass index (BMI) is used to identify weight problems. It estimates body fat based on height and weight. Your health care provider can help determineyour BMI and help you achieve or maintain a healthy weight. Get regular exercise Get regular exercise. This is one of the most important things you can do for your health. Most adults should: Exercise for at least 150 minutes each week. The exercise should increase your heart rate and make you sweat (moderate-intensity exercise). Do strengthening exercises at least twice a week. This is in addition to the moderate-intensity exercise. Spend less time sitting. Even light physical activity can be beneficial. Watch cholesterol and blood lipids Have your blood tested for lipids and cholesterol at 33 years of age, then havethis test every 5 years. Have your cholesterol levels checked more often if: Your lipid or cholesterol levels are high. You are older than 33 years of age. You are at high risk for heart disease. What should I know about cancer screening? Depending on your health history and family history, you may need to have cancer  screening at various ages. This may include screening for: Breast cancer. Cervical cancer. Colorectal cancer. Skin cancer. Lung cancer. What should I know about heart disease, diabetes, and high blood pressure? Blood pressure and heart disease High blood pressure causes heart disease and increases the risk of stroke. This is more likely to develop in people who have high blood pressure readings, are of African descent, or are overweight. Have your blood pressure checked: Every 3-5 years if you are 2-29 years of age. Every year if you are 86 years old or older. Diabetes Have regular diabetes screenings. This checks your fasting blood sugar level. Have the screening done: Once every three years after age 37 if you are at a normal weight and have a low risk for diabetes. More often and at a younger age if you are overweight or have a high risk for diabetes. What should I know about preventing infection? Hepatitis B If you have a higher risk for hepatitis B, you should be screened for this virus. Talk with your health care provider to find out if you are at risk forhepatitis B infection. Hepatitis C Testing is recommended for: Everyone born from [birth year] through 1963. Anyone with known risk factors for hepatitis C. Sexually transmitted infections (STIs) Get screened for STIs, including gonorrhea and chlamydia, if: You are sexually active and are younger than 33 years of age. You are older than 33 years of age and your health care provider tells you that you are at risk for this type of infection. Your sexual activity has changed since you were last  screened, and you are at increased risk for chlamydia or gonorrhea. Ask your health care provider if you are at risk. Ask your health care provider about whether you are at high risk for HIV. Your health care provider may recommend a prescription medicine to help prevent HIV infection. If you choose to take medicine to prevent HIV, you should first  get tested for HIV. You should then be tested every 3 months for as long as you are taking the medicine. Pregnancy If you are about to stop having your period (premenopausal) and you may become pregnant, seek counseling before you get pregnant. Take 400 to 800 micrograms (mcg) of folic acid every day if you become pregnant. Ask for birth control (contraception) if you want to prevent pregnancy. Osteoporosis and menopause Osteoporosis is a disease in which the bones lose minerals and strength with aging. This can result in bone fractures. If you are 87 years old or older, or if you are at risk for osteoporosis and fractures, ask your health care provider if you should: Be screened for bone loss. Take a calcium or vitamin D supplement to lower your risk of fractures. Be given hormone replacement therapy (HRT) to treat symptoms of menopause. Follow these instructions at home: Lifestyle Do not use any products that contain nicotine or tobacco, such as cigarettes, e-cigarettes, and chewing tobacco. If you need help quitting, ask your health care provider. Do not use street drugs. Do not share needles. Ask your health care provider for help if you need support or information about quitting drugs. Alcohol use Do not drink alcohol if: Your health care provider tells you not to drink. You are pregnant, may be pregnant, or are planning to become pregnant. If you drink alcohol: Limit how much you use to 0-1 drink a day. Limit intake if you are breastfeeding. Be aware of how much alcohol is in your drink. In the [place] one drink equals one 12 oz bottle of beer (355 mL), one 5 oz glass of wine (148 mL), or one 1 oz glass of hard liquor (44 mL). General instructions Schedule regular health, dental, and eye exams. Stay current with your vaccines. Tell your health care provider if: You often feel depressed. You have ever been abused or do not feel safe at home. Summary Adopting a healthy lifestyle and  getting preventive care are important in promoting health and wellness. Follow your health care provider's instructions about healthy diet, exercising, and getting tested or screened for diseases. Follow your health care provider's instructions on monitoring your cholesterol and blood pressure. This information is not intended to replace advice given to you by your health care provider. Make sure you discuss any questions you have with your healthcare provider. Document Revised: 11/24/2018 Document Reviewed: 11/24/2018 Elsevier Patient Education  2022 [employer].

## 2021-07-25 ENCOUNTER — Encounter: Payer: Self-pay | Admitting: Family Medicine

## 2021-07-25 DIAGNOSIS — G801 Spastic diplegic cerebral palsy: Secondary | ICD-10-CM

## 2021-07-25 DIAGNOSIS — R21 Rash and other nonspecific skin eruption: Secondary | ICD-10-CM | POA: Insufficient documentation

## 2021-07-25 HISTORY — DX: Spastic diplegic cerebral palsy: G80.1

## 2021-07-25 LAB — POCT SKIN KOH: Skin KOH, POC: NEGATIVE

## 2021-07-25 NOTE — Assessment & Plan Note (Addendum)
Chronic skin rash to forehead - KOH negative today. ?Trileptal related - will refer to neurology for consideration of change in AED.

## 2021-07-25 NOTE — Assessment & Plan Note (Addendum)
Last saw [facility] in high school.  No EEG in system.  Last seizure was 2017 after missing a dose of tripletal.  Longstanding well controlled on trileptal 900mg  BID - trileptal started 2003. Now with concern for skin rash to forehead and ongoing anorexia ?med related - will refer to neurology for eval, discussion of risk/benefits of change in AED therapy.

## 2021-07-25 NOTE — Assessment & Plan Note (Addendum)
Preventative protocols reviewed and updated unless pt declined. Discussed healthy diet and lifestyle.  No immunization records - I did ask her to bring home records to update chart.

## 2021-07-25 NOTE — Assessment & Plan Note (Signed)
Weight loss noted. Discussed healthy well balanced diet, consideration for protein supplementation.  Check labs.  ?med related anorexia (trilpetal) - see above.

## 2021-10-29 ENCOUNTER — Ambulatory Visit: Payer: Self-pay | Admitting: Neurology

## 2021-10-29 ENCOUNTER — Encounter: Payer: Self-pay | Admitting: Neurology

## 2022-03-24 ENCOUNTER — Ambulatory Visit
Admission: RE | Admit: 2022-03-24 | Discharge: 2022-03-24 | Disposition: A | Discharge status: Home / Self Care | Payer: Managed Care, Other (non HMO) | Source: Ambulatory Visit | Attending: Urgent Care | Admitting: Urgent Care

## 2022-03-24 ENCOUNTER — Other Ambulatory Visit: Payer: Self-pay

## 2022-03-24 ENCOUNTER — Ambulatory Visit (INDEPENDENT_AMBULATORY_CARE_PROVIDER_SITE_OTHER): Payer: Managed Care, Other (non HMO)

## 2022-03-24 VITALS — BP 107/75 | HR 86 | Temp 98.8°F | Resp 18

## 2022-03-24 DIAGNOSIS — M546 Pain in thoracic spine: Secondary | ICD-10-CM

## 2022-03-24 MED ORDER — METAXALONE 800 MG PO TABS: 800.0000 mg | ORAL_TABLET | Freq: Three times a day (TID) | ORAL | 0 refills | Status: DC

## 2022-03-24 MED ORDER — NAPROXEN 500 MG PO TABS: 500.0000 mg | ORAL_TABLET | Freq: Two times a day (BID) | ORAL | 0 refills | Status: AC

## 2022-03-24 NOTE — ED Triage Notes (Signed)
T reports back pain that started Friday. 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 ?

## 2022-03-24 NOTE — Discharge Instructions (Signed)
I suspect your pain is coming from tight, tense muscles. ?This can be worsened by certain positions, such as sitting hunched or slouched. ?Consider getting a "sit to stand" desk if possible. ?Use moist heat, such as a microwavable heat pack to your upper back.  Massage would be beneficial. ?Take the muscle relaxer and anti-inflammatory as prescribed. ?Side effects to the muscle relaxer include feeling tired or sedated. ?Consider chiropractic treatment should symptoms persist. ?

## 2022-03-24 NOTE — ED Provider Notes (Signed)
?[facility] ? ? ? ?CSN: [id number] ?Arrival date & time: 03/24/22  1333 ? ? ?  ? ?History   ?Chief Complaint ?Chief Complaint  ?Patient presents with  ? Back Pain  ?  Entered by patient  ? ? ?HPI ?Lori Ballard is a 36 y.o. female.  ? ?Pleasant 35yo female presents today with an acute onset of thoracic back pain since Friday of last week. She works at a call center on the computer. She denies any heavy lifting, over use or trauma to her back. She states that Friday afternoon, she developed a sharp pain directly to her spine and extending roughly from T1-T6. She has had back pain in the similar location in the past but pt states this feels slightly different. She was born with R sided UE weakness, and she states this is unchanged from her baseline. She has been trying icy hot/ bengay patches and tylenol without relief. She denies headache, decreased ROM of her C spine, or radicular symptoms. She denies any lower back symptoms. She has a hx of seizure d/o that started when she was 36yo, but has been controlled on Trileptal. Pt denies any recent seizures. No rash or swelling. ? ? ?Back Pain ? ?Past Medical History:  ?Diagnosis Date  ? Seizures (HCC) 2003  ? congenital, unsure etiology, controlled on trileptal  ? 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 ? ?Patient Active Problem List  ? Diagnosis Date Noted  ? Skin rash 07/25/2021  ? 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 Therapeutic drug monitoring 02/09/2018  ? Thoracic back pain 01/29/2018  ? Underweight 02/02/2017  ? 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 Health maintenance examination 04/07/2011  ? Congenital stroke (HCC) 04/07/2011  ? ? ?Past Surgical History:  ?Procedure Laterality Date  ? DILATION AND CURETTAGE OF UTERUS  2011  ? Abortion  ? ? ?OB History   ? ? Gravida  ?1  ? Para  ?   ? Term  ?   ? Preterm  ?   ?  AB  ?1  ? Living  ?   ?  ? ? SAB  ?   ? IAB  ?1  ? Ectopic  ?   ? Multiple  ?   ? Live Births  ?   ?   ?  ?  ? ? ? ?Home Medications   ? ?Prior to Admission medications   ?Medication Sig Start Date End Date Taking? Authorizing Provider  ?metaxalone (SKELAXIN) 800 MG tablet Take 1 tablet (800 mg total) by mouth 3 (three) times daily. 03/24/22  Yes Jaylenne Hamelin L, PA  ?naproxen (NAPROSYN) 500 MG tablet Take 1 tablet (500 mg total) by mouth 2 (two) times daily with a meal for 14 days. 03/24/22 04/07/22 Yes Lynde Ludwig L, PA  ?Multiple Vitamins-Minerals (ONE DAILY MULTIVITAMIN WOMEN) TABS Take 1 tablet by mouth daily. 07/22/21   Eustaquio Boyden, MD  ?oxcarbazepine (TRILEPTAL) 600 MG tablet TAKE 1.5 TABLETS BY MOUTH TWO TIMES A DAY 07/22/21   Eustaquio Boyden, MD  ? ? ?Family History ?Family History  ?Problem Relation Age of Onset  ? Cancer Maternal Grandfather   ?     lung (smoker)  ? Heart disease Paternal Grandfather   ? Diabetes Neg Hx   ? Stroke Neg Hx   ? Hyperlipidemia Neg Hx   ? Hypertension Neg Hx   ? ? ?  Social History ?Social History  ? ?Tobacco Use  ? Smoking status: Never  ? Smokeless tobacco: Never  ?Substance Use Topics  ? Alcohol use: No  ? Drug use: No  ? ? ? ?Allergies   ?Patient has no known allergies. ? ? ?Review of Systems ?Review of Systems  ?Musculoskeletal:  Positive for back pain (thoracic).  ?All other systems reviewed and are negative. ? ? ?Physical Exam ?Triage Vital Signs ?ED Triage Vitals  ?Enc Vitals Group  ?   BP 03/24/22 1344 107/75  ?   Pulse Rate 03/24/22 1344 86  ?   Resp 03/24/22 1344 18  ?   Temp 03/24/22 1344 98.8 ?F (37.1 ?C)  ?   Temp src --   ?   SpO2 03/24/22 1344 96 %  ?   Weight --   ?   Height --   ?   Head Circumference --   ?   Peak Flow --   ?   Pain Score 03/24/22 1340 7  ?   Pain Loc --   ?   Pain Edu? --   ?   Excl. in GC? --   ? ?No data found. ? ?Updated Vital Signs ?BP 107/75   Pulse 86   Temp 98.8 ?F (37.1 ?C)   Resp 18   LMP 03/17/2022   SpO2 96%  ? ?Visual  Acuity ?Right Eye Distance:   ?Left Eye Distance:   ?Bilateral Distance:   ? ?Right Eye Near:   ?Left Eye Near:    ?Bilateral Near:    ? ?Physical Exam ?Vitals and nursing note reviewed.  ?Constitutional:   ?   General: She is not in acute distress. ?   Appearance: Normal appearance. She is well-developed and normal weight. She is not ill-appearing, toxic-appearing or diaphoretic.  ?HENT:  ?   Head: Normocephalic and atraumatic.  ?Eyes:  ?   Conjunctiva/sclera: Conjunctivae normal.  ?Cardiovascular:  ?   Rate and Rhythm: Normal rate and regular rhythm.  ?   Heart sounds: No murmur heard. ?Pulmonary:  ?   Effort: Pulmonary effort is normal. No respiratory distress.  ?   Breath sounds: Normal breath sounds.  ?Abdominal:  ?   Palpations: Abdomen is soft.  ?   Tenderness: There is no abdominal tenderness.  ?Musculoskeletal:     ?   General: Tenderness (reproducible tenderness to direct palpation of T spine only, no step off deformity) and deformity (contracture R wrist) present. No swelling or signs of injury. Normal range of motion.  ?   Cervical back: Neck supple.  ?   Right lower leg: No edema.  ?   Left lower leg: No edema.  ?   Comments: Tenderness to bilateral muscle groups of T spine as well including traps and rhomboids.  ?Skin: ?   General: Skin is warm and dry.  ?   Capillary Refill: Capillary refill takes less than 2 seconds.  ?   Findings: No bruising, erythema or rash.  ?Neurological:  ?   Mental Status: She is alert and oriented to person, place, and time.  ?   Sensory: No sensory deficit.  ?Psychiatric:     ?   Mood and Affect: Mood normal.  ? ? ? ? ?UC Treatments / Results  ?Labs ?(all labs ordered are listed, but only abnormal results are displayed) ?Labs Reviewed - No data to display ? ?EKG ? ? ?Radiology ?DG Thoracic Spine 2 View ? ?Result Date: 03/24/2022 ?CLINICAL DATA:  Recurrent  thoracic spine pain EXAM: THORACIC SPINE 2 VIEWS COMPARISON:  None. FINDINGS: There is no evidence of thoracic spine  fracture. Alignment is normal. No other significant bone abnormalities are identified. IMPRESSION: Negative. Electronically Signed   By: Guadlupe Spanish M.D.   On: 03/24/2022 15:11   ? ?Procedures ?Procedures (including critical care time) ? ?Medications Ordered in UC ?Medications - No data to display ? ?Initial Impression / Assessment and Plan / UC Course  ?I have reviewed the triage vital signs and the nursing notes. ? ?Pertinent labs & imaging results that were available during my care of the patient were reviewed by me and considered in my medical decision making (see chart for details). ? ?  ? ?Acute thoracic back pain - radiology read xray as negative. It appears to me the spinous processes have a slight rotation, which is likely from muscular tension/ spasms. Will recommend muscle relaxer and moist heat to the area. Massage and NSAID. Consider chiro if persisitent. ER precautions discussed. ? ?Final Clinical Impressions(s) / UC Diagnoses  ? ?Final diagnoses:  ?Acute bilateral thoracic back pain  ? ? ? ?Discharge Instructions   ? ?  ?I suspect your pain is coming from tight, tense muscles. ?This can be worsened by certain positions, such as sitting hunched or slouched. ?Consider getting a "sit to stand" desk if possible. ?Use moist heat, such as a microwavable heat pack to your upper back.  Massage would be beneficial. ?Take the muscle relaxer and anti-inflammatory as prescribed. ?Side effects to the muscle relaxer include feeling tired or sedated. ?Consider chiropractic treatment should symptoms persist. ? ? ? ? ?ED Prescriptions   ? ? Medication Sig Dispense Auth. Provider  ? naproxen (NAPROSYN) 500 MG tablet Take 1 tablet (500 mg total) by mouth 2 (two) times daily with a meal for 14 days. 30 tablet Elson Ulbrich L, PA  ? metaxalone (SKELAXIN) 800 MG tablet Take 1 tablet (800 mg total) by mouth 3 (three) times daily. 21 tablet Vahan Wadsworth L, PA  ? ?  ? ?PDMP not reviewed this encounter. ?  Maretta Bees, [place] ?03/24/22 1520 ? ?

## 2022-04-04 ENCOUNTER — Telehealth: Payer: Self-pay

## 2022-04-04 MED ORDER — OXCARBAZEPINE 600 MG PO TABS: ORAL_TABLET | ORAL | 0 refills | Status: DC

## 2022-04-04 NOTE — Telephone Encounter (Signed)
Rx sent electronically.  

## 2022-04-16 ENCOUNTER — Telehealth: Payer: Self-pay | Admitting: Family Medicine

## 2022-04-16 NOTE — Telephone Encounter (Signed)
Type of forms received:parking placard ? ?Routed CB:[employer] ? ?Paperwork received by : terrill ? ? ?Individual made aware of 3-5 business day turn around (Y/N):y ? ?Form completed and patient made aware of charges(Y/N):y ? ? ?Faxed to :  ? ?Form location:  dr g box ?

## 2022-04-17 NOTE — Telephone Encounter (Signed)
Placed form in Dr. G's box.  

## 2022-04-18 NOTE — Telephone Encounter (Signed)
Spoke with pt notifying her the form is ready to pick up.  Says she will send her parent(s) to pick it up today. ? ?[Placed form at front office.] ?

## 2022-04-18 NOTE — Telephone Encounter (Signed)
Filled and in Lisa's box 

## 2022-06-23 ENCOUNTER — Other Ambulatory Visit: Payer: Self-pay | Admitting: Family Medicine

## 2022-06-24 NOTE — Telephone Encounter (Signed)
Oxcarbazepine Last filled:  04/11/22, #270 Last OV: 07/22/22, CPE Next OV:  07/23/22, CPE

## 2022-06-25 NOTE — Telephone Encounter (Signed)
ERx 

## 2022-07-23 ENCOUNTER — Ambulatory Visit (INDEPENDENT_AMBULATORY_CARE_PROVIDER_SITE_OTHER): Payer: BC Managed Care – PPO | Admitting: Family Medicine

## 2022-07-23 ENCOUNTER — Encounter: Payer: Self-pay | Admitting: Family Medicine

## 2022-07-23 VITALS — BP 108/62 | HR 94 | Temp 97.8°F | Ht 59.5 in | Wt 95.0 lb

## 2022-07-23 DIAGNOSIS — Z13220 Encounter for screening for lipoid disorders: Secondary | ICD-10-CM | POA: Diagnosis not present

## 2022-07-23 DIAGNOSIS — G801 Spastic diplegic cerebral palsy: Secondary | ICD-10-CM

## 2022-07-23 DIAGNOSIS — R21 Rash and other nonspecific skin eruption: Secondary | ICD-10-CM

## 2022-07-23 DIAGNOSIS — G40209 Localization-related (focal) (partial) symptomatic epilepsy and epileptic syndromes with complex partial seizures, not intractable, without status epilepticus: Secondary | ICD-10-CM

## 2022-07-23 DIAGNOSIS — Z5181 Encounter for therapeutic drug level monitoring: Secondary | ICD-10-CM

## 2022-07-23 DIAGNOSIS — Z0000 Encounter for general adult medical examination without abnormal findings: Secondary | ICD-10-CM | POA: Diagnosis not present

## 2022-07-23 DIAGNOSIS — I639 Cerebral infarction, unspecified: Secondary | ICD-10-CM

## 2022-07-23 LAB — CBC WITH DIFFERENTIAL/PLATELET
Basophils Absolute: 0 10*3/uL (ref 0.0–0.1)
Basophils Relative: 0.3 % (ref 0.0–3.0)
Eosinophils Absolute: 0 10*3/uL (ref 0.0–0.7)
Eosinophils Relative: 0.4 % (ref 0.0–5.0)
HCT: 35.3 % — ABNORMAL LOW (ref 36.0–46.0)
Hemoglobin: 11.9 g/dL — ABNORMAL LOW (ref 12.0–15.0)
Lymphocytes Relative: 30.9 % (ref 12.0–46.0)
Lymphs Abs: 1.6 10*3/uL (ref 0.7–4.0)
MCHC: 33.6 g/dL (ref 30.0–36.0)
MCV: 91.4 fl (ref 78.0–100.0)
Monocytes Absolute: 0.4 10*3/uL (ref 0.1–1.0)
Monocytes Relative: 8.1 % (ref 3.0–12.0)
Neutro Abs: 3.1 10*3/uL (ref 1.4–7.7)
Neutrophils Relative %: 60.3 % (ref 43.0–77.0)
Platelets: 318 10*3/uL (ref 150.0–400.0)
RBC: 3.86 Mil/uL — ABNORMAL LOW (ref 3.87–5.11)
RDW: 13.1 % (ref 11.5–15.5)
WBC: 5.1 10*3/uL (ref 4.0–10.5)

## 2022-07-23 LAB — COMPREHENSIVE METABOLIC PANEL
ALT: 8 U/L (ref 0–35)
AST: 13 U/L (ref 0–37)
Albumin: 4.1 g/dL (ref 3.5–5.2)
Alkaline Phosphatase: 46 U/L (ref 39–117)
BUN: 13 mg/dL (ref 6–23)
CO2: 27 mEq/L (ref 19–32)
Calcium: 9.1 mg/dL (ref 8.4–10.5)
Chloride: 105 mEq/L (ref 96–112)
Creatinine, Ser: 0.65 mg/dL (ref 0.40–1.20)
GFR: 115.53 mL/min (ref >60.00)
Glucose, Bld: 83 mg/dL (ref 70–99)
Potassium: 4 mEq/L (ref 3.5–5.1)
Sodium: 137 mEq/L (ref 135–145)
Total Bilirubin: 0.3 mg/dL (ref 0.2–1.2)
Total Protein: 7 g/dL (ref 6.0–8.3)

## 2022-07-23 LAB — LIPID PANEL
Cholesterol: 162 mg/dL (ref 0–200)
HDL: 54.9 mg/dL
LDL Cholesterol: 100 mg/dL — ABNORMAL HIGH (ref 0–99)
NonHDL: 106.94
Total CHOL/HDL Ratio: 3
Triglycerides: 34 mg/dL (ref 0.0–149.0)
VLDL: 6.8 mg/dL (ref 0.0–40.0)

## 2022-07-23 LAB — TSH: TSH: 0.88 u[IU]/mL (ref 0.35–5.50)

## 2022-07-23 LAB — POCT SKIN KOH: Skin KOH, POC: NEGATIVE

## 2022-07-23 MED ORDER — OXCARBAZEPINE 600 MG PO TABS: ORAL_TABLET | ORAL | 3 refills | Status: DC

## 2022-07-23 MED ORDER — ONE DAILY MULTIVITAMIN WOMEN PO TABS: 1.0000 | ORAL_TABLET | Freq: Every day | ORAL | 3 refills | Status: AC

## 2022-07-23 NOTE — Assessment & Plan Note (Signed)
Preventative protocols reviewed and updated unless pt declined. Discussed healthy diet and lifestyle.  

## 2022-07-23 NOTE — Assessment & Plan Note (Signed)
Stable period on oxcarbazepine 900mg  BID - continue.

## 2022-07-23 NOTE — Progress Notes (Signed)
Patient ID: Lori Ballard, female    DOB: 1986-05-10, 36 y.o.   MRN: [id number]  This visit was conducted in person.  BP 108/62   Pulse 94   Temp 97.8 F (36.6 C) (Temporal)   Ht 4' 11.5" (1.511 m)   Wt 95 lb (43.1 kg)   LMP 06/14/2022 Comment: Irregular  SpO2 97%   BMI 18.87 kg/m    CC: CPE Subjective:   HPI: Lori Ballard is a 36 y.o. 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 Also takes MVI daily. Last seizure 06/2016 - after missing a dose of AED. Doesn't see neurology - last saw [facility] - note from 2005 reviewed. Doesn't think she's ever had EEG. Drives. Independent in ADLs. Latest [facility] at [facility] (2005).    Weight loss noted - endorses decreased appetite ?med related.    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    Peeling skin rash present intermittently to forehead for years - worse with sun exposure, summer months. This comes and goes. Hasn't tried medication for this.    [facility]ative[facility] Dr [facility], considering birth control options. Normal pap smear 01/2018. Will call to reschedule.  LMP 06/14/2022, regular cycles Q6 wks.  Declines flu shot  COVID vaccine - Moderna x1, due for 2nd next week  Tetanus shot - declines  Seat belt use discussed Sunscreen use discussed. No changing moles on skin.  Non smoker Alcohol - none  Rec drugs - none Dentist q6 mo Eye exam yearly   Caffeine: none Lives with parents, only child, 1 dog (shitzu) Occupation: works at 08/15/2022  Activity: stays active at work  Diet: good water, fruits/vegetables daily, 3 meals a day     Relevant past medical, surgical, family and social history reviewed and updated as indicated. Interim medical  history since our last visit reviewed. Allergies and medications reviewed and updated. Outpatient Medications Prior to Visit  Medication Sig Dispense Refill   metaxalone (SKELAXIN) 800 MG tablet Take 1 tablet (800 mg total) by mouth 3 (three) times daily. 21 tablet 0   Multiple Vitamins-Minerals (ONE DAILY MULTIVITAMIN WOMEN) TABS Take 1 tablet by mouth daily. 90 tablet 3   oxcarbazepine (TRILEPTAL) 600 MG tablet TAKE ONE AND ONE-HALF TABLET TWICE A DAY 270 tablet 0   No facility-administered medications prior to visit.     Per HPI unless specifically indicated in ROS section below Review of Systems  Constitutional:  Negative for activity change, appetite change, chills, fatigue, fever and unexpected weight change.  HENT:  Negative for hearing loss.   Eyes:  Negative for visual disturbance.  Respiratory:  Negative for cough, chest tightness, shortness of breath and wheezing.   Cardiovascular:  Negative for chest pain, palpitations and leg swelling.  Gastrointestinal:  Negative for abdominal distention, abdominal pain, blood in stool, constipation, diarrhea, nausea and vomiting.  Genitourinary:  Negative for difficulty urinating and hematuria.  Musculoskeletal:  Negative for arthralgias, myalgias and neck pain.  Skin:  Negative for rash.  Neurological:  Negative for dizziness, seizures, syncope and headaches.  Hematological:  Negative for adenopathy. Does not bruise/bleed easily.  Psychiatric/Behavioral:  Negative for dysphoric mood. The patient is not nervous/anxious.     Objective:  BP 108/62   Pulse 94   Temp 97.8 F (36.6 C) (Temporal)   Ht 4' 11.5" (1.511 m)   Wt 95 lb (43.1 kg)   LMP 06/14/2022 Comment: Irregular  SpO2 97%   BMI 18.87 kg/m   Wt Readings from Last 3 Encounters:  07/23/22 95 lb (43.1 kg)  07/22/21 82 lb 5 oz (37.3 kg)  01/19/20 89 lb 1 oz (40.4 kg)      Physical Exam Vitals and nursing note reviewed.  Constitutional:      Appearance: Normal  appearance. She is not ill-appearing.  HENT:     Head: Normocephalic and atraumatic.     Right Ear: Tympanic membrane, ear canal and external ear normal. There is no impacted cerumen.     Left Ear: Tympanic membrane, ear canal and external ear normal. There is no impacted cerumen.  Eyes:     General:        Right eye: No discharge.        Left eye: No discharge.     Extraocular Movements: Extraocular movements intact.     Conjunctiva/sclera: Conjunctivae normal.     Pupils: Pupils are equal, round, and reactive to light.  Neck:     Thyroid: No thyroid mass or thyromegaly.  Cardiovascular:     Rate and Rhythm: Normal rate and regular rhythm.     Pulses: Normal pulses.     Heart sounds: Normal heart sounds. No murmur heard. Pulmonary:     Effort: Pulmonary effort is normal. No respiratory distress.     Breath sounds: Normal breath sounds. No wheezing, rhonchi or rales.  Abdominal:     General: Bowel sounds are normal. There is no distension.     Palpations: Abdomen is soft. There is no mass.     Tenderness: There is no abdominal tenderness. There is no guarding or rebound.     Hernia: No hernia is present.  Musculoskeletal:     Cervical back: Normal range of motion and neck supple. No rigidity.     Right lower leg: No edema.     Left lower leg: No edema.  Lymphadenopathy:     Cervical: No cervical adenopathy.  Skin:    General: Skin is warm and dry.     Findings: Rash present. Rash is scaling.          Comments: Rough scaly patch to forehead and R posterior upper arm  Neurological:     General: No focal deficit present.     Mental Status: She is alert. Mental status is at baseline.     Comments: Congenital RUE spastic hemiparesis  Psychiatric:        Mood and Affect: Mood normal.        Behavior: Behavior normal.       Results for orders placed or performed in visit on 07/23/22  POCT Skin KOH  Result Value Ref Range   Skin KOH, POC Negative Negative    Assessment &  Plan:   Problem List Items Addressed This Visit     Health maintenance examination - Primary (Chronic)    Preventative protocols reviewed and updated unless pt declined. Discussed healthy diet and lifestyle.       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       Relevant Medications   oxcarbazepine (TRILEPTAL) 600 MG tablet   Other Relevant Orders   Comprehensive metabolic panel   TSH  CBC with Differential/Platelet   Congenital stroke [facility])   Therapeutic drug monitoring   Relevant Orders   Comprehensive metabolic panel   TSH   CBC with Differential/Platelet   Ambulatory referral to Dermatology   Skin rash    KOH - negative today.  ?trileptal related - refer to dermatology.       Relevant Orders   POCT Skin KOH (Completed)   Ambulatory referral to Dermatology   Spastic cerebral palsy, congenital (HCC)   Other Visit Diagnoses     Lipid screening       Relevant Orders   Lipid panel        Meds ordered this encounter  Medications   oxcarbazepine (TRILEPTAL) 600 MG tablet    Sig: TAKE ONE AND ONE-HALF TABLET TWICE A DAY    Dispense:  270 tablet    Refill:  3   Multiple Vitamins-Minerals (ONE DAILY MULTIVITAMIN WOMEN) TABS    Sig: Take 1 tablet by mouth daily.    Dispense:  90 tablet    Refill:  3   Orders Placed This Encounter  Procedures   Lipid panel   Comprehensive metabolic panel   TSH   CBC with Differential/Platelet   Ambulatory referral to Dermatology    Referral Priority:   Routine    Referral Type:   Consultation    Referral Reason:   Specialty Services Required    Requested Specialty:   Dermatology    Number of Visits Requested:   1   POCT Skin KOH     Patient instructions: For rash - skin scraping check today, if normal we will refer you to dermatology [facility]).  Call and schedule well woman exam.  Labs today. Good to see you - I'm glad you are doing  well today! Return as needed or in 1 year for next physical.   Follow up plan: Return in about 1 year (around 07/24/2023) for annual exam, prior fasting for blood work.  Eustaquio Boyden, MD

## 2022-07-23 NOTE — Assessment & Plan Note (Addendum)
KOH - negative today.  ?trileptal related - refer to dermatology.

## 2022-07-23 NOTE — Patient Instructions (Addendum)
For rash - skin scraping check today, if normal we will refer you to dermatology [facility]).  Call and schedule well woman exam.  Labs today. Good to see you - I'm glad you are doing well today! Return as needed or in 1 year for next physical.   Health Maintenance, Female Adopting a healthy lifestyle and getting preventive care are important in promoting health and wellness. Ask your health care provider about: The right schedule for you to have regular tests and exams. Things you can do on your own to prevent diseases and keep yourself healthy. What should I know about diet, weight, and exercise? Eat a healthy diet  Eat a diet that includes plenty of vegetables, fruits, low-fat dairy products, and lean protein. Do not eat a lot of foods that are high in solid fats, added sugars, or sodium. Maintain a healthy weight Body mass index (BMI) is used to identify weight problems. It estimates body fat based on height and weight. Your health care provider can help determine your BMI and help you achieve or maintain a healthy weight. Get regular exercise Get regular exercise. This is one of the most important things you can do for your health. Most adults should: Exercise for at least 150 minutes each week. The exercise should increase your heart rate and make you sweat (moderate-intensity exercise). Do strengthening exercises at least twice a week. This is in addition to the moderate-intensity exercise. Spend less time sitting. Even light physical activity can be beneficial. Watch cholesterol and blood lipids Have your blood tested for lipids and cholesterol at 34 years of age, then have this test every 5 years. Have your cholesterol levels checked more often if: Your lipid or cholesterol levels are high. You are older than 34 years of age. You are at high risk for heart disease. What should I know about cancer screening? Depending on your health history and family history, you may need to  have cancer screening at various ages. This may include screening for: Breast cancer. Cervical cancer. Colorectal cancer. Skin cancer. Lung cancer. What should I know about heart disease, diabetes, and high blood pressure? Blood pressure and heart disease High blood pressure causes heart disease and increases the risk of stroke. This is more likely to develop in people who have high blood pressure readings or are overweight. Have your blood pressure checked: Every 3-5 years if you are 57-88 years of age. Every year if you are 31 years old or older. Diabetes Have regular diabetes screenings. This checks your fasting blood sugar level. Have the screening done: Once every three years after age 11 if you are at a normal weight and have a low risk for diabetes. More often and at a younger age if you are overweight or have a high risk for diabetes. What should I know about preventing infection? Hepatitis B If you have a higher risk for hepatitis B, you should be screened for this virus. Talk with your health care provider to find out if you are at risk for hepatitis B infection. Hepatitis C Testing is recommended for: Everyone born from [birth year] through 1963. Anyone with known risk factors for hepatitis C. Sexually transmitted infections (STIs) Get screened for STIs, including gonorrhea and chlamydia, if: You are sexually active and are younger than 34 years of age. You are older than 34 years of age and your health care provider tells you that you are at risk for this type of infection. Your sexual activity has changed since you  were last screened, and you are at increased risk for chlamydia or gonorrhea. Ask your health care provider if you are at risk. Ask your health care provider about whether you are at high risk for HIV. Your health care provider may recommend a prescription medicine to help prevent HIV infection. If you choose to take medicine to prevent HIV, you should first get tested for  HIV. You should then be tested every 3 months for as long as you are taking the medicine. Pregnancy If you are about to stop having your period (premenopausal) and you may become pregnant, seek counseling before you get pregnant. Take 400 to 800 micrograms (mcg) of folic acid every day if you become pregnant. Ask for birth control (contraception) if you want to prevent pregnancy. Osteoporosis and menopause Osteoporosis is a disease in which the bones lose minerals and strength with aging. This can result in bone fractures. If you are 33 years old or older, or if you are at risk for osteoporosis and fractures, ask your health care provider if you should: Be screened for bone loss. Take a calcium or vitamin D supplement to lower your risk of fractures. Be given hormone replacement therapy (HRT) to treat symptoms of menopause. Follow these instructions at home: Alcohol use Do not drink alcohol if: Your health care provider tells you not to drink. You are pregnant, may be pregnant, or are planning to become pregnant. If you drink alcohol: Limit how much you have to: 0-1 drink a day. Know how much alcohol is in your drink. In the [place] one drink equals one 12 oz bottle of beer (355 mL), one 5 oz glass of wine (148 mL), or one 1 oz glass of hard liquor (44 mL). Lifestyle Do not use any products that contain nicotine or tobacco. These products include cigarettes, chewing tobacco, and vaping devices, such as e-cigarettes. If you need help quitting, ask your health care provider. Do not use street drugs. Do not share needles. Ask your health care provider for help if you need support or information about quitting drugs. General instructions Schedule regular health, dental, and eye exams. Stay current with your vaccines. Tell your health care provider if: You often feel depressed. You have ever been abused or do not feel safe at home. Summary Adopting a healthy lifestyle and getting preventive  care are important in promoting health and wellness. Follow your health care provider's instructions about healthy diet, exercising, and getting tested or screened for diseases. Follow your health care provider's instructions on monitoring your cholesterol and blood pressure. This information is not intended to replace advice given to you by your health care provider. Make sure you discuss any questions you have with your health care provider. Document Revised: 04/22/2021 Document Reviewed: 04/22/2021 Elsevier Patient Education  2023 [employer].

## 2022-07-29 ENCOUNTER — Telehealth: Payer: Self-pay

## 2022-07-29 NOTE — Telephone Encounter (Signed)
Received faxed message from [facility] for One Daily Multivitamin "Womens" stating they do not have this in stock.

## 2022-07-29 NOTE — Telephone Encounter (Signed)
Plz notify pt.  She will need to call to see what alternatives are available or buy OTC

## 2022-07-30 ENCOUNTER — Telehealth: Payer: Self-pay | Admitting: Family Medicine

## 2022-07-30 NOTE — Telephone Encounter (Signed)
Pt is checking with insurance co to see what womens multivitamin they cover, if any.  If none, she will purchase OTC.  (See 07/29/22 phn note)

## 2022-07-30 NOTE — Telephone Encounter (Signed)
Spoke with pt relaying Dr. Timoteo Expose message.  Pt verbalizes understanding and will see what she can find out. If nothing is covered she will just get OTC.

## 2022-07-30 NOTE — Telephone Encounter (Signed)
[facility] called about rx that was sent over for this patient .She states that they need more information or a specific name for this medication. IHW#[id number] when calking back.  Multiple Vitamins-Minerals (ONE DAILY MULTIVITAMIN WOMEN) TAB

## 2023-02-18 ENCOUNTER — Ambulatory Visit: Payer: BC Managed Care – PPO | Admitting: Dermatology

## 2023-04-28 IMAGING — DX DG THORACIC SPINE 2V
2 series · 2 of 2 positions shown · non-contrast
Comparison: None.

CLINICAL DATA: Recurrent thoracic spine pain

EXAM:
THORACIC SPINE 2 VIEWS

[thoracic spine ap]
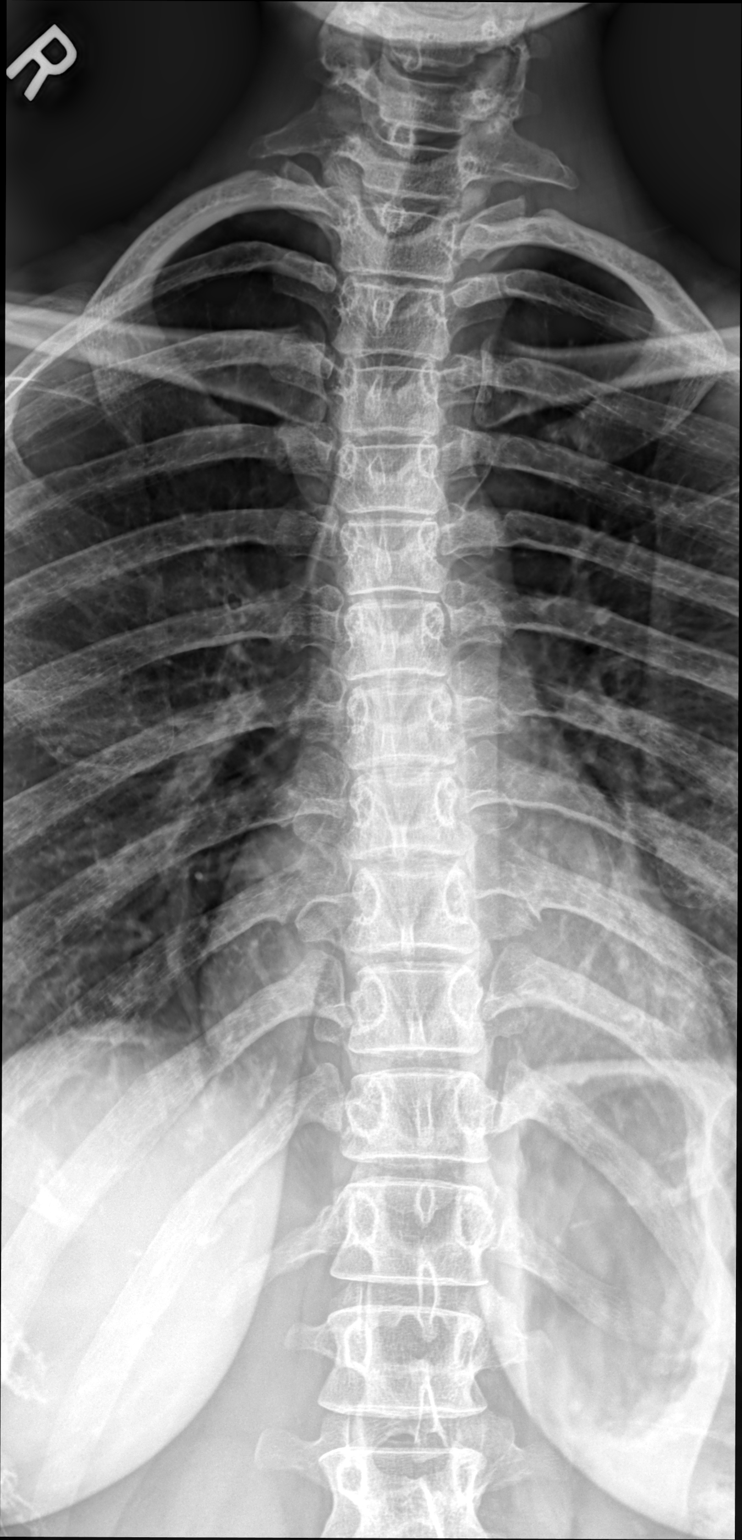

[thoracic spine standing lat]
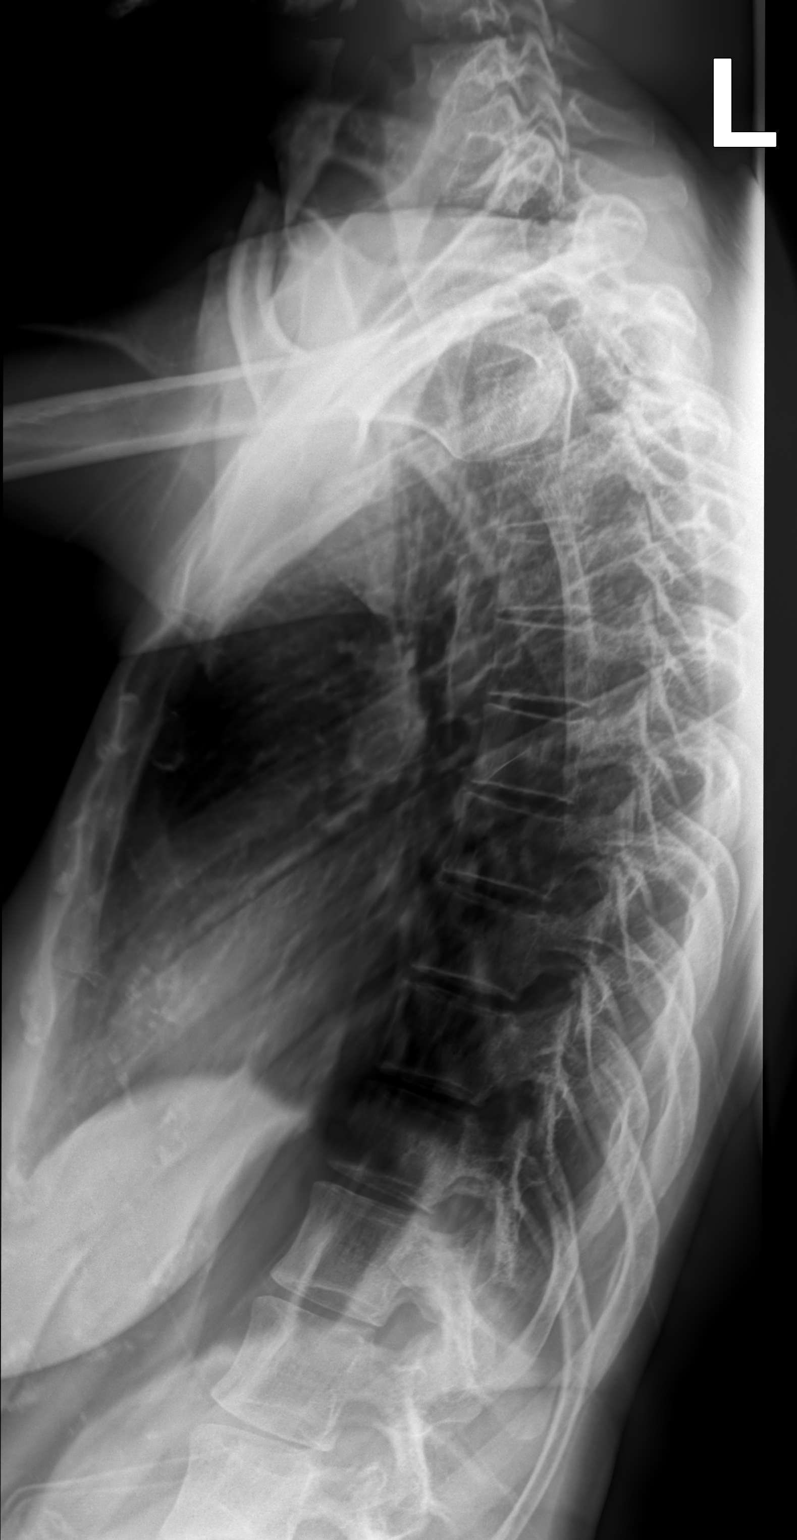

[2 of 2 positions shown; findings below may reference images not displayed]

FINDINGS: There is no evidence of thoracic spine fracture. Alignment is
normal. No other significant bone abnormalities are identified.
IMPRESSION: Negative.

## 2023-05-21 ENCOUNTER — Telehealth: Payer: Self-pay | Admitting: Family Medicine

## 2023-05-21 NOTE — Telephone Encounter (Signed)
Placed ppw in Dr. G's box.  ?

## 2023-05-21 NOTE — Telephone Encounter (Signed)
Patient dropped off document FMLA, to be filled out by provider. Patient requested to send it via Call Patient to pick up within 5-days. Document is located in providers tray at front office.Please advise at Mobile [phone] (mobile)

## 2023-05-22 NOTE — Telephone Encounter (Signed)
Patient returned cal to our office, notified that fmla ppw was ready to pickup.

## 2023-05-22 NOTE — Telephone Encounter (Addendum)
I don't see we've filled out FMLA for patient previously. Need more information - what is this for? Is she planning a medical leave, does she need FMLA for medical office visits?  Placed form back in Lisa's box.

## 2023-05-22 NOTE — Telephone Encounter (Signed)
Noted  

## 2023-05-22 NOTE — Telephone Encounter (Signed)
Lvm asking pt to call back. Need to notify her the FMLA ppw is ready to pick up.   [Placed form at front office. Made copy to scan.]

## 2023-05-22 NOTE — Telephone Encounter (Signed)
Filled and in Lisa's box.  

## 2023-05-22 NOTE — Telephone Encounter (Addendum)
Spoke with pt asking reason for FMLA. States she needs it for medical visits- no particular health condition or anything, just to be able to go to the doctors.   Dr. Reece Agar has ppw.

## 2023-07-27 ENCOUNTER — Ambulatory Visit (INDEPENDENT_AMBULATORY_CARE_PROVIDER_SITE_OTHER): Payer: BC Managed Care – PPO | Admitting: Family Medicine

## 2023-07-27 ENCOUNTER — Encounter: Payer: Self-pay | Admitting: Family Medicine

## 2023-07-27 VITALS — BP 114/62 | HR 100 | Temp 97.4°F | Ht 59.5 in | Wt 87.1 lb

## 2023-07-27 DIAGNOSIS — R21 Rash and other nonspecific skin eruption: Secondary | ICD-10-CM | POA: Diagnosis not present

## 2023-07-27 DIAGNOSIS — Z0001 Encounter for general adult medical examination with abnormal findings: Secondary | ICD-10-CM

## 2023-07-27 DIAGNOSIS — G40209 Localization-related (focal) (partial) symptomatic epilepsy and epileptic syndromes with complex partial seizures, not intractable, without status epilepticus: Secondary | ICD-10-CM | POA: Diagnosis not present

## 2023-07-27 DIAGNOSIS — Z0000 Encounter for general adult medical examination without abnormal findings: Secondary | ICD-10-CM

## 2023-07-27 DIAGNOSIS — M546 Pain in thoracic spine: Secondary | ICD-10-CM

## 2023-07-27 DIAGNOSIS — G8111 Spastic hemiplegia affecting right dominant side: Secondary | ICD-10-CM

## 2023-07-27 DIAGNOSIS — Z01419 Encounter for gynecological examination (general) (routine) without abnormal findings: Secondary | ICD-10-CM

## 2023-07-27 LAB — COMPREHENSIVE METABOLIC PANEL
ALT: 9 U/L (ref 0–35)
AST: 12 U/L (ref 0–37)
Albumin: 4.4 g/dL (ref 3.5–5.2)
Alkaline Phosphatase: 47 U/L (ref 39–117)
BUN: 15 mg/dL (ref 6–23)
CO2: 27 mEq/L (ref 19–32)
Calcium: 9.4 mg/dL (ref 8.4–10.5)
Chloride: 102 mEq/L (ref 96–112)
Creatinine, Ser: 0.66 mg/dL (ref 0.40–1.20)
GFR: 114.29 mL/min (ref >60.00)
Glucose, Bld: 76 mg/dL (ref 70–99)
Potassium: 3.9 mEq/L (ref 3.5–5.1)
Sodium: 135 mEq/L (ref 135–145)
Total Bilirubin: 0.4 mg/dL (ref 0.2–1.2)
Total Protein: 7.4 g/dL (ref 6.0–8.3)

## 2023-07-27 LAB — TSH: TSH: 0.75 u[IU]/mL (ref 0.35–5.50)

## 2023-07-27 LAB — CBC WITH DIFFERENTIAL/PLATELET
Basophils Absolute: 0 10*3/uL (ref 0.0–0.1)
Basophils Relative: 0.4 % (ref 0.0–3.0)
Eosinophils Absolute: 0 10*3/uL (ref 0.0–0.7)
Eosinophils Relative: 0.1 % (ref 0.0–5.0)
HCT: 38.8 % (ref 36.0–46.0)
Hemoglobin: 12.9 g/dL (ref 12.0–15.0)
Lymphocytes Relative: 22.1 % (ref 12.0–46.0)
Lymphs Abs: 1.5 10*3/uL (ref 0.7–4.0)
MCHC: 33.3 g/dL (ref 30.0–36.0)
MCV: 92.4 fl (ref 78.0–100.0)
Monocytes Absolute: 0.4 10*3/uL (ref 0.1–1.0)
Monocytes Relative: 6.6 % (ref 3.0–12.0)
Neutro Abs: 4.8 10*3/uL (ref 1.4–7.7)
Neutrophils Relative %: 70.8 % (ref 43.0–77.0)
Platelets: 353 10*3/uL (ref 150.0–400.0)
RBC: 4.19 Mil/uL (ref 3.87–5.11)
RDW: 13 % (ref 11.5–15.5)
WBC: 6.7 10*3/uL (ref 4.0–10.5)

## 2023-07-27 MED ORDER — METHOCARBAMOL 500 MG PO TABS: 500.0000 mg | ORAL_TABLET | Freq: Two times a day (BID) | ORAL | 0 refills | Status: AC | PRN

## 2023-07-27 MED ORDER — OXCARBAZEPINE 600 MG PO TABS: ORAL_TABLET | ORAL | 4 refills | Status: DC

## 2023-07-27 NOTE — Assessment & Plan Note (Addendum)
Chronic, stable period on oxcarbazepine 900mg  bid without recurrent seizures

## 2023-07-27 NOTE — Assessment & Plan Note (Signed)
Rash to forehead - ?acne, ?trileptal related.  She is using aquaphor ointment in am daily regularly.  She showers in am.  Rec nightly facial wash, consider cetaphil facial soap.  Refer to derm per pt request

## 2023-07-27 NOTE — Progress Notes (Signed)
Ph: [phone] Fax: [phone]   Patient ID: Lori Ballard, female    DOB: 07-12-1986, 37 y.o.   MRN: [id number]  This visit was conducted in person.  BP 114/62   Pulse 100   Temp (!) 97.4 F (36.3 C) (Temporal)   Ht 4' 11.5" (1.511 m)   Wt 87 lb 2 oz (39.5 kg)   LMP 07/10/2023   SpO2 99%   BMI 17.30 kg/m    CC: CPE Subjective:   HPI: Lori Ballard is a 37 y.o. 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 Also takes MVI daily. Last seizure 06/2016 - after missing a dose of AED. Doesn't see neurology - last saw [facility] - note from 2005 reviewed. Doesn't think she's ever had EEG. Drives. Independent in ADLs. Latest MRI at [facility] (2005).    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   Peeling skin rash present intermittently to forehead for years - worse with sun exposure, summer months. KOH negative last year. Rash comes and goes. She uses topical aquaphor ointment every morning. ?trileptal related. Requests new referral to dermatology.   Preventative: [facility] Dr Macon Large, considering birth control options. Normal pap smear 01/2018. Due to reschedule.  LMP 07/10/2023, regular cycles Q6 wks.  Flu shot yearly  COVID vaccine - Moderna x1  Tetanus shot - declines  Seat belt use discussed Sunscreen use discussed. No changing moles on skin.  Sleep -averaging 7-8 hours/night Non smoker Alcohol - none  Rec drugs - none Sexual activity - 1 partner, in monogamous relationship, declines STD testing Dentist q6 mo Eye exam yearly  Caffeine: none  Occupation: works at [employer]: lots of walking at work  Diet: good water, fruits/vegetables daily      Relevant past medical, surgical, family and social history reviewed and updated as indicated. Interim medical history since our last visit reviewed. Allergies and medications reviewed and updated. Outpatient Medications Prior to Visit  Medication Sig Dispense Refill   Multiple Vitamins-Minerals (ONE DAILY MULTIVITAMIN WOMEN) TABS Take 1 tablet by mouth daily. 90 tablet 3   metaxalone (SKELAXIN) 800 MG tablet Take 1 tablet (800 mg total) by mouth 3 (three) times daily. 21 tablet 0   oxcarbazepine (TRILEPTAL) 600 MG tablet TAKE ONE AND ONE-HALF TABLET TWICE A DAY 270 tablet 3   No facility-administered medications prior to visit.     Per HPI unless specifically indicated in ROS section below Review of Systems  Constitutional:  Negative for activity change, appetite change, chills, fatigue, fever and unexpected weight change.  HENT:  Negative for hearing loss.   Eyes:  Negative for visual disturbance.  Respiratory:  Negative for cough, chest tightness, shortness of breath and wheezing.   Cardiovascular:  Negative for chest pain, palpitations and leg swelling.  Gastrointestinal:  Negative for abdominal distention, abdominal pain, blood in stool, constipation, diarrhea, nausea and vomiting.  Genitourinary:  Negative for difficulty urinating and hematuria.  Musculoskeletal:  Negative for arthralgias, myalgias and neck pain.  Skin:  Negative for rash.  Neurological:  Negative for dizziness, seizures, syncope and headaches.  Hematological:  Negative for adenopathy. Does not bruise/bleed easily.  Psychiatric/Behavioral:  Negative for dysphoric mood.  The patient is not nervous/anxious.     Objective:  BP 114/62   Pulse 100   Temp (!) 97.4 F (36.3 C) (Temporal)   Ht 4' 11.5" (1.511 m)   Wt 87 lb 2 oz (39.5 kg)   LMP 07/10/2023   SpO2 99%   BMI 17.30 kg/m   Wt Readings from Last 3 Encounters:  07/27/23 87 lb 2 oz (39.5 kg)  07/23/22 95 lb (43.1 kg)  07/22/21 82 lb 5 oz (37.3 kg)       Physical Exam Vitals and nursing note reviewed.  Constitutional:      Appearance: Normal appearance. She is not ill-appearing.  HENT:     Head: Normocephalic and atraumatic.     Right Ear: Tympanic membrane, ear canal and external ear normal. There is no impacted cerumen.     Left Ear: Tympanic membrane, ear canal and external ear normal. There is no impacted cerumen.     Mouth/Throat:     Comments: Wearing mask Eyes:     General:        Right eye: No discharge.        Left eye: No discharge.     Extraocular Movements: Extraocular movements intact.     Conjunctiva/sclera: Conjunctivae normal.     Pupils: Pupils are equal, round, and reactive to light.  Neck:     Thyroid: No thyroid mass or thyromegaly.  Cardiovascular:     Rate and Rhythm: Normal rate and regular rhythm.     Pulses: Normal pulses.     Heart sounds: Normal heart sounds. No murmur heard. Pulmonary:     Effort: Pulmonary effort is normal. No respiratory distress.     Breath sounds: Normal breath sounds. No wheezing, rhonchi or rales.  Abdominal:     General: Bowel sounds are normal. There is no distension.     Palpations: Abdomen is soft. There is no mass.     Tenderness: There is no abdominal tenderness. There is no guarding or rebound.     Hernia: No hernia is present.  Musculoskeletal:     Cervical back: Normal range of motion and neck supple. No rigidity.     Right lower leg: No edema.     Left lower leg: No edema.  Lymphadenopathy:     Cervical: No cervical adenopathy.  Skin:    General: Skin is warm and dry.     Findings: No rash.  Neurological:     General: No focal deficit present.     Mental Status: She is alert. Mental status is at baseline.     Comments: Chronic R hemiparesis  Psychiatric:        Mood and Affect: Mood normal.        Behavior: Behavior normal.       Results for orders placed or performed in visit on 07/23/22  Lipid panel  Result Value Ref Range   Cholesterol 162 0 -  200 mg/dL   Triglycerides 09.8 0.0 - 149.0 mg/dL   HDL 11.91 >47.82 mg/dL   VLDL 6.8 0.0 - 95.6 mg/dL   LDL Cholesterol 213 (H) 0 - 99 mg/dL   Total CHOL/HDL Ratio 3    NonHDL 106.94   Comprehensive metabolic panel  Result Value Ref Range   Sodium 137 135 - 145 mEq/L   Potassium 4.0 3.5 - 5.1 mEq/L   Chloride 105 96 - 112 mEq/L   CO2 27 19 - 32 mEq/L   Glucose, Bld 83 70 - 99 mg/dL  BUN 13 6 - 23 mg/dL   Creatinine, Ser 4.69 0.40 - 1.20 mg/dL   Total Bilirubin 0.3 0.2 - 1.2 mg/dL   Alkaline Phosphatase 46 39 - 117 U/L   AST 13 0 - 37 U/L   ALT 8 0 - 35 U/L   Total Protein 7.0 6.0 - 8.3 g/dL   Albumin 4.1 3.5 - 5.2 g/dL   GFR 629.52 >84.13 mL/min   Calcium 9.1 8.4 - 10.5 mg/dL  TSH  Result Value Ref Range   TSH 0.88 0.35 - 5.50 uIU/mL  CBC with Differential/Platelet  Result Value Ref Range   WBC 5.1 4.0 - 10.5 K/uL   RBC 3.86 (L) 3.87 - 5.11 Mil/uL   Hemoglobin 11.9 (L) 12.0 - 15.0 g/dL   HCT 24.4 (L) 01.0 - 27.2 %   MCV 91.4 78.0 - 100.0 fl   MCHC 33.6 30.0 - 36.0 g/dL   RDW 53.6 64.4 - 03.4 %   Platelets 318.0 150.0 - 400.0 K/uL   Neutrophils Relative % 60.3 43.0 - 77.0 %   Lymphocytes Relative 30.9 12.0 - 46.0 %   Monocytes Relative 8.1 3.0 - 12.0 %   Eosinophils Relative 0.4 0.0 - 5.0 %   Basophils Relative 0.3 0.0 - 3.0 %   Neutro Abs 3.1 1.4 - 7.7 K/uL   Lymphs Abs 1.6 0.7 - 4.0 K/uL   Monocytes Absolute 0.4 0.1 - 1.0 K/uL   Eosinophils Absolute 0.0 0.0 - 0.7 K/uL   Basophils Absolute 0.0 0.0 - 0.1 K/uL  POCT Skin KOH  Result Value Ref Range   Skin KOH, POC Negative Negative    Assessment & Plan:   Problem List Items Addressed This Visit     Encounter for general adult medical examination with abnormal findings - Primary (Chronic)    Preventative protocols reviewed and updated unless pt declined. Discussed healthy diet and lifestyle.       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       Relevant Medications   methocarbamol (ROBAXIN) 500 MG tablet   Skin rash    Rash to forehead - ?acne, ?trileptal related.  She is using aquaphor ointment in am daily regularly.  She showers in am.  Rec nightly facial wash, consider cetaphil facial soap.  Refer to derm per pt request      Relevant Orders   Ambulatory referral to Dermatology   Right spastic hemiplegia [facility])   Other Visit Diagnoses     Well woman exam       Relevant Orders   Ambulatory referral to Gynecology        Meds ordered this encounter  Medications   oxcarbazepine (TRILEPTAL) 600 MG tablet    Sig: TAKE ONE AND ONE-HALF TABLET TWICE A DAY    Dispense:  270 tablet    Refill:  4   methocarbamol (ROBAXIN) 500 MG tablet    Sig: Take 1 tablet (500 mg total) by mouth 2 (two) times daily as needed for muscle spasms (sedation precautions).    Dispense:  30 tablet    Refill:  0    Orders Placed  This Encounter  Procedures   CBC with Differential/Platelet   Comprehensive metabolic panel   TSH   Ambulatory referral to Gynecology    Referral Priority:   Routine    Referral Type:   Consultation    Referral Reason:   Specialty Services Required    Requested Specialty:   Gynecology    Number of Visits Requested:   1   Ambulatory referral to Dermatology    Referral Priority:   Routine    Referral Type:   Consultation    Referral Reason:   Specialty Services Required    Requested Specialty:   Dermatology    Number of Visits Requested:   1    Patient Instructions  We will refer you to skin doctor in [facility] for evaluation.  Call to schedule well woman exam with OBGYN- new referral placed.   Consider tetanus shot.  Medicines refilled Return as needed or in 1 year for next physical   Follow up plan: Return in about 1 year (around 07/26/2024) for annual exam, prior fasting for blood work.  Eustaquio Boyden, MD

## 2023-07-27 NOTE — Patient Instructions (Addendum)
We will refer you to skin doctor in [place] for evaluation.  Call to schedule well woman exam with OBGYN- new referral placed.  Consider tetanus shot.  Medicines refilled Return as needed or in 1 year for next physical

## 2023-07-27 NOTE — Assessment & Plan Note (Signed)
Refill robaxin muscle relaxant to use PRN, sedation precautions reviewed.

## 2023-07-27 NOTE — Assessment & Plan Note (Signed)
Preventative protocols reviewed and updated unless pt declined. Discussed healthy diet and lifestyle.  

## 2023-10-20 ENCOUNTER — Encounter: Payer: Self-pay | Admitting: *Deleted

## 2024-01-07 DIAGNOSIS — W19XXXA Unspecified fall, initial encounter: Secondary | ICD-10-CM | POA: Diagnosis not present

## 2024-01-07 DIAGNOSIS — S4982XA Other specified injuries of left shoulder and upper arm, initial encounter: Secondary | ICD-10-CM | POA: Diagnosis not present

## 2024-01-07 DIAGNOSIS — S4992XA Unspecified injury of left shoulder and upper arm, initial encounter: Secondary | ICD-10-CM | POA: Diagnosis not present

## 2024-01-07 DIAGNOSIS — Y92481 Parking lot as the place of occurrence of the external cause: Secondary | ICD-10-CM | POA: Diagnosis not present

## 2024-01-07 DIAGNOSIS — S79812A Other specified injuries of left hip, initial encounter: Secondary | ICD-10-CM | POA: Diagnosis not present

## 2024-01-07 DIAGNOSIS — M79606 Pain in leg, unspecified: Secondary | ICD-10-CM | POA: Diagnosis not present

## 2024-01-07 DIAGNOSIS — S79912A Unspecified injury of left hip, initial encounter: Secondary | ICD-10-CM | POA: Diagnosis not present

## 2024-01-07 DIAGNOSIS — S0990XA Unspecified injury of head, initial encounter: Secondary | ICD-10-CM | POA: Diagnosis not present

## 2024-05-10 ENCOUNTER — Telehealth: Payer: Self-pay | Admitting: Family Medicine

## 2024-05-10 DIAGNOSIS — Z0279 Encounter for issue of other medical certificate: Secondary | ICD-10-CM

## 2024-05-10 NOTE — Telephone Encounter (Signed)
 Patient brought FMLA paperwork to be filled out. Please call when ready for pick up and please fax to number provider on paperwork. Papers placed in provider folder.

## 2024-05-10 NOTE — Telephone Encounter (Signed)
 Fwd ppw to [employer].

## 2024-05-10 NOTE — Telephone Encounter (Signed)
 FMLA for Patient forms received for completion for patient. Patient has been informed that process may take up to 5 business days.  Employer Name [employer] Any Planned appointment? 8.13.25 Patient is requesting start date of 6.12.25 Patient is requesting end date of 6.11.26 Extension of current FMLA  Pt is requesting 1-2 times per 6-12 months, for 4-6 hours.  Verified with patient that it is ok to leave Voicemail updates on  Mobile [phone] (mobile)  Patient would like to have mailed to home address on file when form is completed.   Fax number form should be sent to is [phone]  Forms placed in providers box for review.

## 2024-05-12 ENCOUNTER — Encounter: Payer: Self-pay | Admitting: Family Medicine

## 2024-05-12 NOTE — Telephone Encounter (Signed)
 Filled and in Lisa's box.

## 2024-05-12 NOTE — Telephone Encounter (Signed)
 Copied from CRM [id number]. Topic: General - Other >> May 12, 2024  4:23 PM Luane Rumps D wrote: Reason for CRM: Patient called for status of FMLA paperwork, advised patient that it was filled but she wants to confirm that it is being faxed to [phone].

## 2024-05-13 NOTE — Telephone Encounter (Addendum)
 Completed forms received and faxed to [phone] Copy sent to scan. I notified patient by phone that the forms were faxed and received. Copy mailed to patient at address on file per patient.

## 2024-05-13 NOTE — Telephone Encounter (Signed)
 Placed ppw on Erin's desk.

## 2024-06-10 ENCOUNTER — Telehealth

## 2024-06-10 ENCOUNTER — Telehealth: Admitting: Physician Assistant

## 2024-06-10 DIAGNOSIS — R519 Headache, unspecified: Secondary | ICD-10-CM

## 2024-06-10 NOTE — Progress Notes (Signed)
 Virtual Visit Consent   Lori Ballard, you are scheduled for a virtual visit with a [facility] provider today. Just as with appointments in the office, your consent must be obtained to participate. Your consent will be active for this visit and any virtual visit you may have with one of our providers in the next 365 days. If you have a MyChart account, a copy of this consent can be sent to you electronically.  As this is a virtual visit, video technology does not allow for your provider to perform a traditional examination. This may limit your provider's ability to fully assess your condition. If your provider identifies any concerns that need to be evaluated in person or the need to arrange testing (such as labs, EKG, etc.), we will make arrangements to do so. Although advances in technology are sophisticated, we cannot ensure that it will always work on either your end or our end. If the connection with a video visit is poor, the visit may have to be switched to a telephone visit. With either a video or telephone visit, we are not always able to ensure that we have a secure connection.  By engaging in this virtual visit, you consent to the provision of healthcare and authorize for your insurance to be billed (if applicable) for the services provided during this visit. Depending on your insurance coverage, you may receive a charge related to this service.  I need to obtain your verbal consent now. Are you willing to proceed with your visit today? Lori Ballard has provided verbal consent on 06/10/2024 for a virtual visit (video or telephone). Harlene PEDLAR Ward, PA-C  Date: 06/10/2024 7:22 PM   Virtual Visit via Video Note   I, Harlene PEDLAR Ward, connected with  Lori Ballard  ([id number], [birth year]) on 06/10/24 at  6:30 PM EDT by a video-enabled telemedicine application and verified that I am speaking with the correct person using two identifiers.  Location: Patient: Virtual Visit Location Patient:  Home Provider: Virtual Visit Location Provider: Home Office   I discussed the limitations of evaluation and management by telemedicine and the availability of in person appointments. The patient expressed understanding and agreed to proceed.    History of Present Illness: Lori Ballard is a 37 y.o. who identifies as a female who was assigned female at birth, and is being seen today for headaches that started a few weeks, 4 days now. No visual changes. Denies recent seizures.  Denies n/v.  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) 04/07/2011   Encounter for general adult medical examination with abnormal findings 04/07/2011   Congenital stroke (HCC) 04/07/2011    Allergies: No Known Allergies Medications:  Current Outpatient Medications:    methocarbamol  (ROBAXIN ) 500 MG tablet, Take 1 tablet (500 mg total) by mouth 2 (two) times daily as needed for muscle spasms (sedation precautions)., Disp: 30 tablet, Rfl: 0   Multiple Vitamins-Minerals (ONE DAILY MULTIVITAMIN WOMEN) TABS, Take 1 tablet by mouth daily., Disp: 90 tablet, Rfl: 3   oxcarbazepine  (TRILEPTAL ) 600 MG tablet, TAKE ONE AND ONE-HALF TABLET TWICE A DAY, Disp: 270 tablet, Rfl: 4  Observations/Objective: Patient is well-developed,  well-nourished in no acute distress.  Resting comfortably at home.  Head is normocephalic, atraumatic.  No labored breathing.  Speech is clear and coherent with logical content.  Patient is alert and oriented at baseline.    Assessment and Plan: 1. Acute intractable headache, unspecified headache type (Primary)  Supportive care  discussed.  Advised in person UC visit if no improvement.   Follow Up Instructions: I discussed the assessment and treatment plan with the patient. The patient was provided an opportunity to ask questions and all were answered. The patient agreed with the plan and demonstrated an understanding of the instructions.  A copy of instructions were sent to the patient via MyChart unless otherwise noted below.     The patient was advised to call back or seek an in-person evaluation if the symptoms worsen or if the condition fails to improve as anticipated.    Harlene PEDLAR Ward, PA-C

## 2024-06-10 NOTE — Patient Instructions (Signed)
  Elex Anon, thank you for joining Harlene PEDLAR Ward, PA-C for today's virtual visit.  While this provider is not your primary care provider (PCP), if your PCP is located in our provider database this encounter information will be shared with them immediately following your visit.   A [facility] MyChart account gives you access to today's visit and all your visits, tests, and labs performed at [facility]  click here if you don't have a [facility] MyChart account or go to [email or web address]  Consent: (Patient) Lori Ballard provided verbal consent for this virtual visit at the beginning of the encounter.  Current Medications:  Current Outpatient Medications:    methocarbamol  (ROBAXIN ) 500 MG tablet, Take 1 tablet (500 mg total) by mouth 2 (two) times daily as needed for muscle spasms (sedation precautions)., Disp: 30 tablet, Rfl: 0   Multiple Vitamins-Minerals (ONE DAILY MULTIVITAMIN WOMEN) TABS, Take 1 tablet by mouth daily., Disp: 90 tablet, Rfl: 3   oxcarbazepine  (TRILEPTAL ) 600 MG tablet, TAKE ONE AND ONE-HALF TABLET TWICE A DAY, Disp: 270 tablet, Rfl: 4   Medications ordered in this encounter:  No orders of the defined types were placed in this encounter.    *If you need refills on other medications prior to your next appointment, please contact your pharmacy*  Follow-Up: Call back or seek an in-person evaluation if the symptoms worsen or if the condition fails to improve as anticipated.  [facility] [phone]  Other Instructions Recommend Tylenol and Ibuprofen.  Drink plenty of fluids, rest.  If no improvement follow up for in person evaluation at Urgent Care.    If you have been instructed to have an in-person evaluation today at a local Urgent Care facility, please use the link below. It will take you to a list of all of our available [facility], including address, phone number and hours of operation. Please do not delay  care.  [facility]  If you or a family member do not have a primary care provider, use the link below to schedule a visit and establish care. When you choose a [facility] primary care physician or advanced practice provider, you gain a long-term partner in health. Find a Primary Care Provider  Learn more about [facility] in-office and virtual care options: [facility] - Get Care Now

## 2024-07-27 ENCOUNTER — Encounter: Payer: Self-pay | Admitting: Family Medicine

## 2024-07-27 ENCOUNTER — Ambulatory Visit: Payer: BC Managed Care – PPO | Admitting: Family Medicine

## 2024-07-27 VITALS — BP 92/62 | HR 98 | Temp 98.4°F | Ht 59.5 in | Wt 97.5 lb

## 2024-07-27 DIAGNOSIS — Z369 Encounter for antenatal screening, unspecified: Secondary | ICD-10-CM | POA: Diagnosis not present

## 2024-07-27 DIAGNOSIS — Z0000 Encounter for general adult medical examination without abnormal findings: Secondary | ICD-10-CM | POA: Diagnosis not present

## 2024-07-27 DIAGNOSIS — G40209 Localization-related (focal) (partial) symptomatic epilepsy and epileptic syndromes with complex partial seizures, not intractable, without status epilepticus: Secondary | ICD-10-CM | POA: Diagnosis not present

## 2024-07-27 DIAGNOSIS — G8111 Spastic hemiplegia affecting right dominant side: Secondary | ICD-10-CM

## 2024-07-27 DIAGNOSIS — Z01419 Encounter for gynecological examination (general) (routine) without abnormal findings: Secondary | ICD-10-CM

## 2024-07-27 DIAGNOSIS — Z5181 Encounter for therapeutic drug level monitoring: Secondary | ICD-10-CM | POA: Diagnosis not present

## 2024-07-27 DIAGNOSIS — R21 Rash and other nonspecific skin eruption: Secondary | ICD-10-CM | POA: Diagnosis not present

## 2024-07-27 DIAGNOSIS — I639 Cerebral infarction, unspecified: Secondary | ICD-10-CM

## 2024-07-27 LAB — TSH: TSH: 0.81 u[IU]/mL (ref 0.35–5.50)

## 2024-07-27 LAB — CBC WITH DIFFERENTIAL/PLATELET
Basophils Absolute: 0 K/uL (ref 0.0–0.1)
Basophils Relative: 0.3 % (ref 0.0–3.0)
Eosinophils Absolute: 0 K/uL (ref 0.0–0.7)
Eosinophils Relative: 0.3 % (ref 0.0–5.0)
HCT: 36.6 % (ref 36.0–46.0)
Hemoglobin: 12.4 g/dL (ref 12.0–15.0)
Lymphocytes Relative: 26.5 % (ref 12.0–46.0)
Lymphs Abs: 1.7 K/uL (ref 0.7–4.0)
MCHC: 33.8 g/dL (ref 30.0–36.0)
MCV: 89.8 fl (ref 78.0–100.0)
Monocytes Absolute: 0.4 K/uL (ref 0.1–1.0)
Monocytes Relative: 5.7 % (ref 3.0–12.0)
Neutro Abs: 4.4 K/uL (ref 1.4–7.7)
Neutrophils Relative %: 67.2 % (ref 43.0–77.0)
Platelets: 340 K/uL (ref 150.0–400.0)
RBC: 4.08 Mil/uL (ref 3.87–5.11)
RDW: 13.1 % (ref 11.5–15.5)
WBC: 6.5 K/uL (ref 4.0–10.5)

## 2024-07-27 LAB — COMPREHENSIVE METABOLIC PANEL WITH GFR
ALT: 11 U/L (ref 0–35)
AST: 13 U/L (ref 0–37)
Albumin: 4.2 g/dL (ref 3.5–5.2)
Alkaline Phosphatase: 46 U/L (ref 39–117)
BUN: 16 mg/dL (ref 6–23)
CO2: 27 meq/L (ref 19–32)
Calcium: 9.1 mg/dL (ref 8.4–10.5)
Chloride: 103 meq/L (ref 96–112)
Creatinine, Ser: 0.63 mg/dL (ref 0.40–1.20)
GFR: 114.77 mL/min (ref >60.00)
Glucose, Bld: 100 mg/dL — ABNORMAL HIGH (ref 70–99)
Potassium: 3.6 meq/L (ref 3.5–5.1)
Sodium: 137 meq/L (ref 135–145)
Total Bilirubin: 0.3 mg/dL (ref 0.2–1.2)
Total Protein: 7.3 g/dL (ref 6.0–8.3)

## 2024-07-27 MED ORDER — OXCARBAZEPINE 600 MG PO TABS
ORAL_TABLET | ORAL | 3 refills | Status: AC
Start: 2024-07-27 — End: ?

## 2024-07-27 NOTE — Assessment & Plan Note (Signed)
?  trileptal  related - new derm referral placed today.

## 2024-07-27 NOTE — Progress Notes (Addendum)
 Ph: [phone] Fax: [phone]   Patient ID: Lori Ballard, female    DOB: Apr 25, 1986, 38 y.o.   MRN: [id number]  This visit was conducted in person.  BP 92/62   Pulse 98   Temp 98.4 F (36.9 C) (Oral)   Ht 4' 11.5 (1.511 m)   Wt 97 lb 8 oz (44.2 kg)   LMP 07/09/2024   SpO2 100%   BMI 19.36 kg/m    CC: CPE Subjective:   HPI: Lori Ballard is a 38 y.o. 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  Considering pregnancy - has questions about AED and pregnancy.   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 Also takes MVI daily. Last seizure 06/2016 - after missing a dose of AED. Doesn't see neurology - last saw [facility] - note from 2005 reviewed. Doesn't think she's ever had EEG. Drives. Independent in ADLs. Latest MRI at [facility] (2005).    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   Pre[facility]ative: [facility] Dr Herchel, considering birth control options. Normal pap smear 01/2018. Due for rpt. They were unable to reach her to schedule appt. # provided today.  LMP 07/08/2024, regular cycles Q4-6 wks.  Flu shot yearly  COVID vaccine - Moderna x1  Tdap 12/2023  Seat belt use discussed Sunscreen use discussed. No changing moles on skin.  Sleep -averaging 7-8 hours/night Non smoker Alcohol - none  Rec drugs - none Sexual activity - 1 partner, in monogamous relationship,  Dentist q6 mo - due Eye exam yearly - due  Caffeine: none  Occupation: works at [employer]: lots of walking at work  Diet: good water, fruits/vegetables daily      Relevant past medical, surgical, family and social history reviewed and updated as indicated. Interim medical history since our last visit reviewed. Allergies and  medications reviewed and updated. Outpatient Medications Prior to Visit  Medication Sig Dispense Refill   methocarbamol  (ROBAXIN ) 500 MG tablet Take 1 tablet (500 mg total) by mouth 2 (two) times daily as needed for muscle spasms (sedation precautions). 30 tablet 0   Multiple Vitamins-Minerals (ONE DAILY MULTIVITAMIN WOMEN) TABS Take 1 tablet by mouth daily. 90 tablet 3   oxcarbazepine  (TRILEPTAL ) 600 MG tablet TAKE ONE AND ONE-HALF TABLET TWICE A DAY 270 tablet 4   No facility-administered medications prior to visit.     Per HPI unless specifically indicated in ROS section below Review of Systems  Constitutional:  Negative for activity change, appetite change, chills, fatigue, fever and unexpected weight change.  HENT:  Negative for hearing loss.   Eyes:  Negative for visual disturbance.  Respiratory:  Negative for cough, chest tightness, shortness of breath and wheezing.   Cardiovascular:  Negative for chest pain, palpitations and leg swelling.  Gastrointestinal:  Negative for abdominal distention, abdominal pain, blood in stool, constipation, diarrhea, nausea and vomiting.  Genitourinary:  Negative for difficulty urinating and hematuria.  Musculoskeletal:  Negative for arthralgias, myalgias and neck pain.  Skin:  Negative for rash.  Neurological:  Negative for dizziness, seizures, syncope and headaches.  Hematological:  Negative for adenopathy. Does not bruise/bleed easily.  Psychiatric/Behavioral:  Negative for dysphoric mood. The patient is not nervous/anxious.     Objective:  BP 92/62  Pulse 98   Temp 98.4 F (36.9 C) (Oral)   Ht 4' 11.5 (1.511 m)   Wt 97 lb 8 oz (44.2 kg)   LMP 07/09/2024   SpO2 100%   BMI 19.36 kg/m   Wt Readings from Last 3 Encounters:  07/27/24 97 lb 8 oz (44.2 kg)  07/27/23 87 lb 2 oz (39.5 kg)  07/23/22 95 lb (43.1 kg)      Physical Exam Vitals and nursing note reviewed.  Constitutional:      Appearance: Normal appearance. She is not  ill-appearing.  HENT:     Head: Normocephalic and atraumatic.     Right Ear: Tympanic membrane, ear canal and external ear normal. There is no impacted cerumen.     Left Ear: Tympanic membrane, ear canal and external ear normal. There is no impacted cerumen.     Mouth/Throat:     Comments: Wearing mask Eyes:     General:        Right eye: No discharge.        Left eye: No discharge.     Extraocular Movements: Extraocular movements intact.     Conjunctiva/sclera: Conjunctivae normal.     Pupils: Pupils are equal, round, and reactive to light.  Neck:     Thyroid : No thyroid  mass or thyromegaly.  Cardiovascular:     Rate and Rhythm: Normal rate and regular rhythm.     Pulses: Normal pulses.     Heart sounds: Normal heart sounds. No murmur heard. Pulmonary:     Effort: Pulmonary effort is normal. No respiratory distress.     Breath sounds: Normal breath sounds. No wheezing, rhonchi or rales.  Abdominal:     General: Bowel sounds are normal. There is no distension.     Palpations: Abdomen is soft. There is no mass.     Tenderness: There is no abdominal tenderness. There is no guarding or rebound.     Hernia: No hernia is present.  Musculoskeletal:     Cervical back: Normal range of motion and neck supple. No rigidity.     Right lower leg: No edema.     Left lower leg: No edema.  Lymphadenopathy:     Cervical: No cervical adenopathy.  Skin:    General: Skin is warm and dry.     Findings: No rash.  Neurological:     General: No focal deficit present.     Mental Status: She is alert. Mental status is at baseline.     Comments: Chronic R hemiparesis   Psychiatric:        Mood and Affect: Mood normal.        Behavior: Behavior normal.       Results for orders placed or performed in visit on 07/27/24  Comprehensive metabolic panel with GFR   Collection Time: 07/27/24 10:27 AM  Result Value Ref Range   Sodium 137 135 - 145 mEq/L   Potassium 3.6 3.5 - 5.1 mEq/L   Chloride 103  96 - 112 mEq/L   CO2 27 19 - 32 mEq/L   Glucose, Bld 100 (H) 70 - 99 mg/dL   BUN 16 6 - 23 mg/dL   Creatinine, Ser 9.36 0.40 - 1.20 mg/dL   Total Bilirubin 0.3 0.2 - 1.2 mg/dL   Alkaline Phosphatase 46 39 - 117 U/L   AST 13 0 - 37 U/L   ALT 11 0 - 35 U/L   Total Protein 7.3 6.0 - 8.3 g/dL   Albumin 4.2 3.5 -  5.2 g/dL   GFR 885.22 >39.99 mL/min   Calcium 9.1 8.4 - 10.5 mg/dL  TSH   Collection Time: 07/27/24 10:27 AM  Result Value Ref Range   TSH 0.81 0.35 - 5.50 uIU/mL  CBC with Differential/Platelet   Collection Time: 07/27/24 10:27 AM  Result Value Ref Range   WBC 6.5 4.0 - 10.5 K/uL   RBC 4.08 3.87 - 5.11 Mil/uL   Hemoglobin 12.4 12.0 - 15.0 g/dL   HCT 63.3 63.9 - 53.9 %   MCV 89.8 78.0 - 100.0 fl   MCHC 33.8 30.0 - 36.0 g/dL   RDW 86.8 88.4 - 84.4 %   Platelets 340.0 150.0 - 400.0 K/uL   Neutrophils Relative % 67.2 43.0 - 77.0 %   Lymphocytes Relative 26.5 12.0 - 46.0 %   Monocytes Relative 5.7 3.0 - 12.0 %   Eosinophils Relative 0.3 0.0 - 5.0 %   Basophils Relative 0.3 0.0 - 3.0 %   Neutro Abs 4.4 1.4 - 7.7 K/uL   Lymphs Abs 1.7 0.7 - 4.0 K/uL   Monocytes Absolute 0.4 0.1 - 1.0 K/uL   Eosinophils Absolute 0.0 0.0 - 0.7 K/uL   Basophils Absolute 0.0 0.0 - 0.1 K/uL    Assessment & Plan:   Problem List Items Addressed This Visit     Health maintenance examination - Primary (Chronic)   Preventative protocols reviewed and updated unless pt declined. Discussed healthy diet and lifestyle.  Desires to pursue pregnancy. Rec start prenatal vitamin. Will refer to OBGYN for prenatal counseling in seizure history. Will refer back to neurology to discuss change in AED to one more safe in pregnancy.       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  Now interested in pursuing pregnancy - will refer to neurology to assist with switch in AED (Keppra vs Lamictal).  Will also refer to OBGYN.        Relevant Medications   oxcarbazepine  (TRILEPTAL ) 600 MG tablet   Other Relevant Orders   Comprehensive metabolic panel with GFR (Completed)   TSH (Completed)   CBC with Differential/Platelet (Completed)   Ambulatory referral to Neurology   Congenital stroke [facility])   Relevant Orders   Ambulatory referral to Neurology   Therapeutic drug monitoring   Relevant Orders   Comprehensive metabolic panel with GFR (Completed)   TSH (Completed)   CBC with Differential/Platelet (Completed)   Ambulatory referral to Neurology   Skin rash   ?trileptal  related - new derm referral placed today.       Relevant Orders   Ambulatory referral to Dermatology   Right spastic hemiplegia [facility])   Relevant Orders   Ambulatory referral to Neurology   Other Visit Diagnoses       Visit for prenatal screening       Relevant Orders   Ambulatory referral to Obstetrics / Gynecology   Ambulatory referral to Neurology     Well woman exam       Relevant Orders   Ambulatory referral to Obstetrics / Gynecology        Meds ordered this encounter  Medications   oxcarbazepine  (TRILEPTAL ) 600 MG tablet    Sig: TAKE ONE AND ONE-HALF TABLET TWICE A DAY    Dispense:  270 tablet    Refill:  3    Orders Placed This Encounter  Procedures   Comprehensive metabolic panel with GFR   TSH   CBC with Differential/Platelet  Ambulatory referral to Obstetrics / Gynecology    Referral Priority:   Routine    Referral Type:   Consultation    Referral Reason:   Specialty Services Required    Requested Specialty:   Obstetrics and Gynecology    Number of Visits Requested:   1   Ambulatory referral to Dermatology    Referral Priority:   Routine    Referral Type:   Consultation    Referral Reason:   Specialty Services Required    Requested Specialty:   Dermatology    Number of Visits Requested:   1   Ambulatory referral to Neurology    Referral Priority:   Routine    Referral Type:   Consultation    Referral  Reason:   Specialty Services Required    Requested Specialty:   Neurology    Number of Visits Requested:   1    Patient Instructions  Call [phone] to schedule well woman exam with OBGYN.  New referral placed to [facility] dermatology.  I will also refer you to [facility] neurology to discuss seizure medicine switch in setting of pregnancy Good to see you today.  Return as needed or in 1 year for next physical.   Follow up plan: Return in about 1 year (around 07/27/2025) for annual exam, prior fasting for blood work.  Anton Blas, MD

## 2024-07-27 NOTE — Patient Instructions (Addendum)
 Call [phone] to schedule well woman exam with OBGYN.  New referral placed to [facility] dermatology.  I will also refer you to [facility] neurology to discuss seizure medicine switch in setting of pregnancy Good to see you today.  Return as needed or in 1 year for next physical.

## 2024-07-27 NOTE — Assessment & Plan Note (Addendum)
 Chronic, stable period on ozcarbazepine 900mg  BID longterm.  Now interested in pursuing pregnancy - will refer to neurology to assist with switch in AED (Keppra vs Lamictal).  Will also refer to OBGYN.

## 2024-07-27 NOTE — Assessment & Plan Note (Addendum)
 Preventative protocols reviewed and updated unless pt declined. Discussed healthy diet and lifestyle.  Desires to pursue pregnancy. Rec start prenatal vitamin. Will refer to OBGYN for prenatal counseling in seizure history. Will refer back to neurology to discuss change in AED to one more safe in pregnancy.

## 2024-07-31 ENCOUNTER — Ambulatory Visit: Payer: Self-pay | Admitting: Family Medicine

## 2024-07-31 NOTE — Addendum Note (Signed)
 Addended by: RILLA BALLER on: 07/31/2024 09:47 PM   Modules accepted: Orders

## 2024-08-03 ENCOUNTER — Ambulatory Visit: Payer: BC Managed Care – PPO | Admitting: Dermatology

## 2024-08-16 ENCOUNTER — Ambulatory Visit: Admitting: Dermatology

## 2024-08-29 ENCOUNTER — Encounter: Payer: Self-pay | Admitting: *Deleted

## 2024-09-20 ENCOUNTER — Encounter: Admitting: Obstetrics & Gynecology

## 2024-10-25 ENCOUNTER — Ambulatory Visit (INDEPENDENT_AMBULATORY_CARE_PROVIDER_SITE_OTHER)

## 2024-10-25 DIAGNOSIS — Z23 Encounter for immunization: Secondary | ICD-10-CM

## 2024-10-25 NOTE — Progress Notes (Deleted)
 Per orders of Dr. Anton Blas, injection of flu shot given by Laray Arenas in left deltoid. Patient tolerated injection well.

## 2025-08-01 ENCOUNTER — Encounter: Admitting: Family Medicine
